# Patient Record
Sex: Female | Born: 1955 | Race: White | Hispanic: No | Marital: Married | Smoking: Never smoker
Health system: Southern US, Community
[De-identification: ages and names within clinical notes are randomized; demographics above are authoritative.]

## PROBLEM LIST (undated history)

## (undated) DIAGNOSIS — R51 Headache: Secondary | ICD-10-CM

## (undated) DIAGNOSIS — G4730 Sleep apnea, unspecified: Secondary | ICD-10-CM

## (undated) DIAGNOSIS — I471 Supraventricular tachycardia, unspecified: Secondary | ICD-10-CM

## (undated) DIAGNOSIS — I10 Essential (primary) hypertension: Secondary | ICD-10-CM

## (undated) DIAGNOSIS — R519 Headache, unspecified: Secondary | ICD-10-CM

## (undated) DIAGNOSIS — I341 Nonrheumatic mitral (valve) prolapse: Secondary | ICD-10-CM

## (undated) DIAGNOSIS — C4491 Basal cell carcinoma of skin, unspecified: Secondary | ICD-10-CM

## (undated) HISTORY — DX: Nonrheumatic mitral (valve) prolapse: I34.1

## (undated) HISTORY — DX: Headache, unspecified: R51.9

## (undated) HISTORY — PX: ROOT CANAL: SHX2363

## (undated) HISTORY — DX: Sleep apnea, unspecified: G47.30

## (undated) HISTORY — DX: Essential (primary) hypertension: I10

## (undated) HISTORY — DX: Supraventricular tachycardia, unspecified: I47.10

## (undated) HISTORY — DX: Supraventricular tachycardia: I47.1

## (undated) HISTORY — DX: Basal cell carcinoma of skin, unspecified: C44.91

## (undated) HISTORY — DX: Headache: R51

---

## 1992-12-15 HISTORY — PX: TONSILLECTOMY: SUR1361

## 1996-12-15 HISTORY — PX: BACK SURGERY: SHX140

## 1998-07-19 ENCOUNTER — Observation Stay (HOSPITAL_COMMUNITY): Admission: RE | Admit: 1998-07-19 | Discharge: 1998-07-19 | Payer: Self-pay | Admitting: Neurosurgery

## 1999-03-29 ENCOUNTER — Emergency Department (HOSPITAL_COMMUNITY): Admission: EM | Admit: 1999-03-29 | Discharge: 1999-03-29 | Payer: Self-pay | Admitting: Emergency Medicine

## 2003-02-21 ENCOUNTER — Encounter: Admission: RE | Admit: 2003-02-21 | Discharge: 2003-02-21 | Payer: Self-pay | Admitting: Internal Medicine

## 2003-02-21 ENCOUNTER — Encounter: Payer: Self-pay | Admitting: Internal Medicine

## 2004-10-17 ENCOUNTER — Ambulatory Visit (HOSPITAL_COMMUNITY): Admission: RE | Admit: 2004-10-17 | Discharge: 2004-10-17 | Payer: Self-pay | Admitting: Internal Medicine

## 2006-11-26 ENCOUNTER — Encounter: Admission: RE | Admit: 2006-11-26 | Discharge: 2006-11-26 | Payer: Self-pay | Admitting: Internal Medicine

## 2009-05-29 ENCOUNTER — Encounter: Admission: RE | Admit: 2009-05-29 | Discharge: 2009-05-29 | Payer: Self-pay | Admitting: Internal Medicine

## 2009-06-11 ENCOUNTER — Encounter: Admission: RE | Admit: 2009-06-11 | Discharge: 2009-06-11 | Payer: Self-pay | Admitting: Obstetrics and Gynecology

## 2010-12-12 ENCOUNTER — Encounter
Admission: RE | Admit: 2010-12-12 | Discharge: 2010-12-12 | Payer: Self-pay | Source: Home / Self Care | Attending: Chiropractic Medicine | Admitting: Chiropractic Medicine

## 2011-12-20 IMAGING — CR DG CERVICAL SPINE 2 OR 3 VIEWS
2 series · 2 of 2 positions shown · non-contrast
Comparison: None.

CLINICAL DATA: Migraine headaches.  Neck pain and stiffness.

CERVICAL SPINE - 2-3 VIEW

[w c-spine lat]
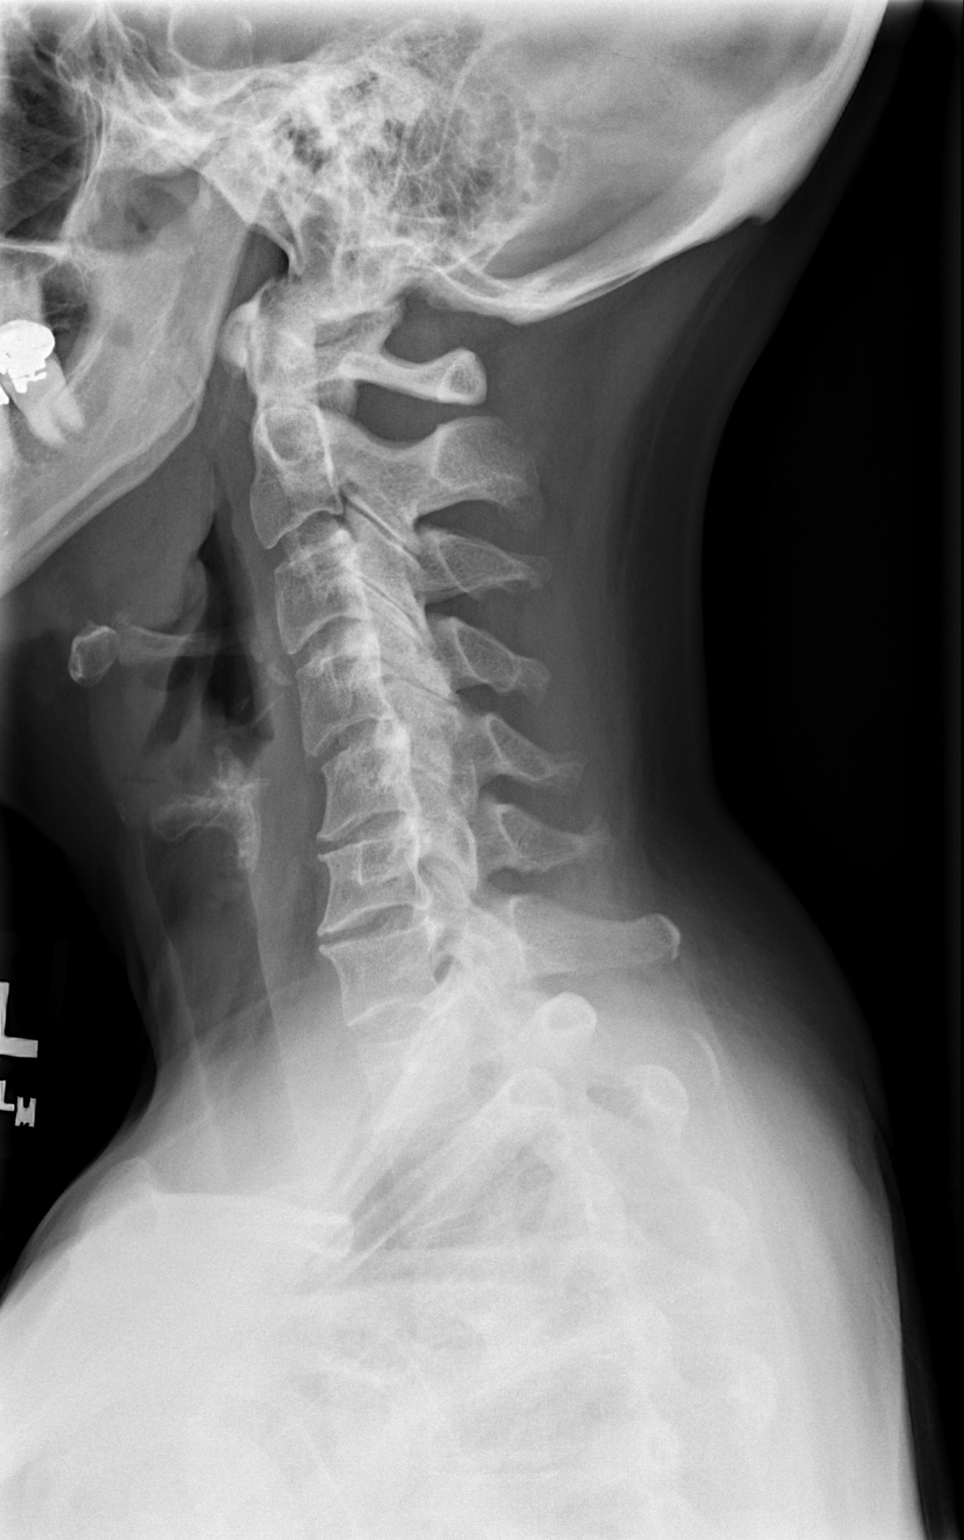

[w c-spine a.p. *]
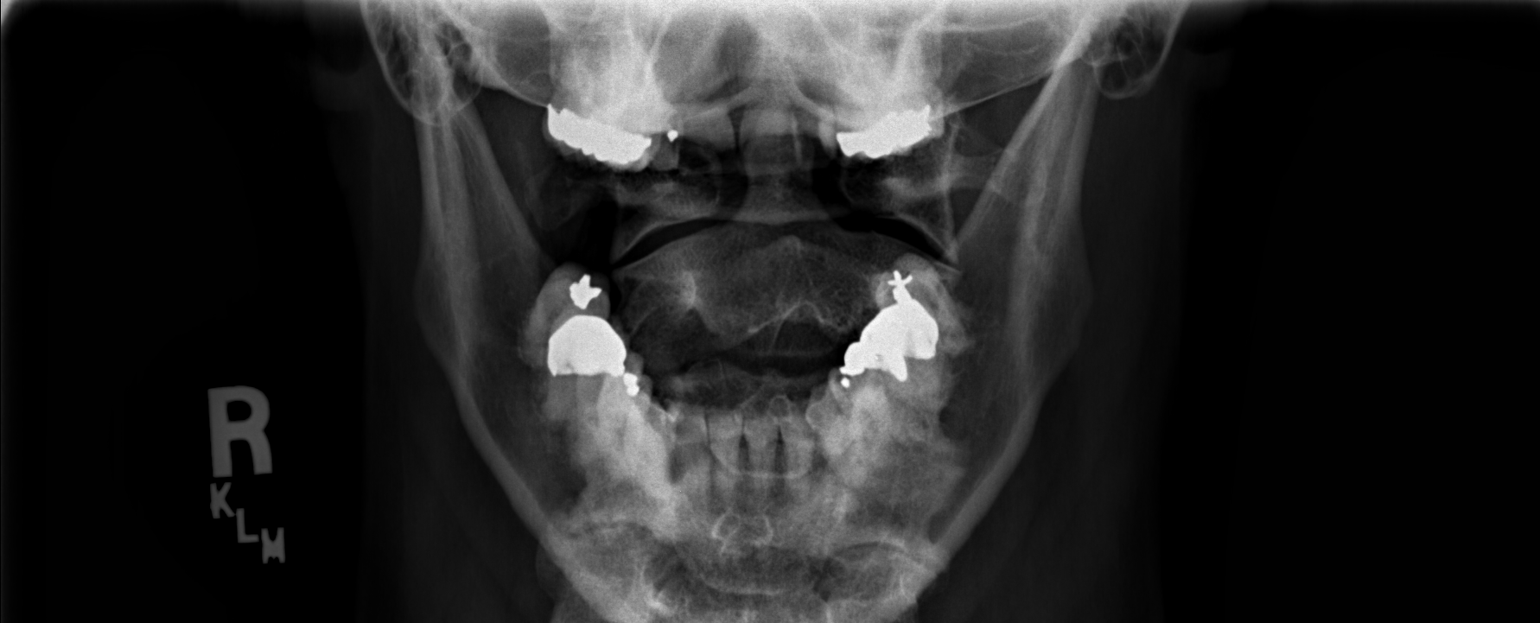

[2 of 2 positions shown; findings below may reference images not displayed]

FINDINGS: Degenerative disc space narrowing is noted at the C5-C6
and C6-C7 levels.  There is mild (2 mm) anterolisthesis of C4 on C5
with loss of the normal cervical lordotic curve.  The
craniovertebral junction has a normal appearance.  There are no
destructive changes and there are no fractures.
IMPRESSION: 1.  C5-C6 and C6-C7 degenerative disc space narrowing.
2.  Mild (2 mm) anterolisthesis of C4 on C5 most likely
degenerative in nature.

## 2012-12-15 HISTORY — PX: COLONOSCOPY: SHX174

## 2013-05-12 ENCOUNTER — Encounter: Payer: Self-pay | Admitting: Neurology

## 2013-05-23 NOTE — Progress Notes (Unsigned)
   CPAP download review.  Patient is poorly compliant with CPAP, then using CPAP she achieves a market reduction in a H. I.  [facility] is already working with her.  Gerldine Suleiman, MD

## 2013-07-06 ENCOUNTER — Encounter: Payer: Self-pay | Admitting: Internal Medicine

## 2013-08-26 ENCOUNTER — Ambulatory Visit (AMBULATORY_SURGERY_CENTER): Payer: BC Managed Care – PPO | Admitting: *Deleted

## 2013-08-26 VITALS — Ht 68.0 in | Wt 149.0 lb

## 2013-08-26 DIAGNOSIS — Z1211 Encounter for screening for malignant neoplasm of colon: Secondary | ICD-10-CM

## 2013-08-26 MED ORDER — MOVIPREP 100 G PO SOLR: ORAL | Status: DC

## 2013-08-26 NOTE — Progress Notes (Signed)
No allergies to eggs or soy. No problems with anesthesia.  

## 2013-08-31 ENCOUNTER — Encounter: Payer: Self-pay | Admitting: Internal Medicine

## 2013-09-09 ENCOUNTER — Encounter: Payer: Self-pay | Admitting: Internal Medicine

## 2013-09-09 ENCOUNTER — Ambulatory Visit (AMBULATORY_SURGERY_CENTER): Payer: BC Managed Care – PPO | Admitting: Internal Medicine

## 2013-09-09 VITALS — BP 139/76 | HR 56 | Temp 98.3°F | Resp 28 | Ht 68.0 in | Wt 149.0 lb

## 2013-09-09 DIAGNOSIS — Z1211 Encounter for screening for malignant neoplasm of colon: Secondary | ICD-10-CM

## 2013-09-09 DIAGNOSIS — D126 Benign neoplasm of colon, unspecified: Secondary | ICD-10-CM

## 2013-09-09 MED ORDER — SODIUM CHLORIDE 0.9 % IV SOLN: 500.0000 mL | INTRAVENOUS | Status: DC

## 2013-09-09 NOTE — Patient Instructions (Addendum)

## 2013-09-09 NOTE — Op Note (Signed)
[facility] [address] [place], [address]   COLONOSCOPY PROCEDURE REPORT  PATIENT: [name], [name]  MR#: [id number] BIRTHDATE: 10-27-1956 , 56  yrs. old GENDER: Female ENDOSCOPIST: Hart Carwin, MD REFERRED NW:[id number] Avva, M.D. PROCEDURE DATE:  09/09/2013 PROCEDURE:   Colonoscopy with biopsy First Screening Colonoscopy - Avg.  risk and is 50 yrs.  old or older Yes.  Prior Negative Screening - Now for repeat screening. N/A  History of Adenoma - Now for follow-up colonoscopy & has been > or = to 3 yrs.  N/A  Polyps Removed Today? Yes. ASA CLASS:   Class I INDICATIONS:Average risk patient for colon cancer. MEDICATIONS: MAC sedation, administered by CRNA and propofol (Diprivan) 250mg  IV  DESCRIPTION OF PROCEDURE:   After the risks benefits and alternatives of the procedure were thoroughly explained, informed consent was obtained.  A digital rectal exam revealed no abnormalities of the rectum.   The LB [id number]-[id number]  endoscope was introduced through the anus and advanced to the cecum, which was identified by both the appendix and ileocecal valve. No adverse events experienced.   The quality of the prep was excellent, using MoviPrep  The instrument was then slowly withdrawn as the colon was fully examined.      COLON FINDINGS: A diminutive firm sessile polyp was found in the sigmoid colon.  A polypectomy was performed with cold forceps.  The resection was complete and the polyp tissue was completely retrieved.   Small internal hemorrhoids were found.  Retroflexed views revealed no abnormalities. The time to cecum=9 minutes 8 seconds.  Withdrawal time=8 minutes 20 seconds.  The scope was withdrawn and the procedure completed. COMPLICATIONS: There were no complications.  ENDOSCOPIC IMPRESSION: 1.   Diminutive sessile polyp was found in the sigmoid colon; polypectomy was performed with cold forceps 2.   Small internal  hemorrhoids  RECOMMENDATIONS: 1.  Await pathology results 2.  High fiber diet 3.   recall colonoscopy 10 years   eSigned:  Hart Carwin, MD 09/09/2013 12:54 PM   cc:   PATIENT NAME:  Patricia Rangel, Patricia Rangel MR#: [id number]

## 2013-09-09 NOTE — Progress Notes (Signed)
Procedure ends, to recovery, report given and VSS. 

## 2013-09-09 NOTE — Progress Notes (Signed)
Called to room to assist during endoscopic procedure.  Patient ID and intended procedure confirmed with present staff. Received instructions for my participation in the procedure from the performing physician.  

## 2013-09-09 NOTE — Progress Notes (Signed)
Patient did not experience any of the following events: a burn prior to discharge; a fall within the facility; wrong site/side/patient/procedure/implant event; or a hospital transfer or hospital admission upon discharge from the facility. (G8907)Patient did not have preoperative order for IV antibiotic SSI prophylaxis. (G8918) ewm 

## 2013-09-12 ENCOUNTER — Telehealth: Payer: Self-pay

## 2013-09-12 NOTE — Telephone Encounter (Signed)
Left a message on the pt's answering machine #[phone] to call us back if she has any questions or concerns.maw

## 2013-09-13 ENCOUNTER — Encounter: Payer: Self-pay | Admitting: Internal Medicine

## 2013-09-27 ENCOUNTER — Other Ambulatory Visit: Payer: Self-pay | Admitting: Obstetrics and Gynecology

## 2015-03-21 ENCOUNTER — Other Ambulatory Visit: Payer: Self-pay | Admitting: Obstetrics and Gynecology

## 2015-03-22 LAB — CYTOLOGY - PAP

## 2016-08-13 ENCOUNTER — Encounter: Payer: Self-pay | Admitting: Neurology

## 2016-08-13 ENCOUNTER — Ambulatory Visit (INDEPENDENT_AMBULATORY_CARE_PROVIDER_SITE_OTHER): Payer: BLUE CROSS/BLUE SHIELD | Admitting: Neurology

## 2016-08-13 VITALS — BP 124/84 | HR 88 | Resp 20 | Ht 69.0 in | Wt 150.0 lb

## 2016-08-13 DIAGNOSIS — G43109 Migraine with aura, not intractable, without status migrainosus: Secondary | ICD-10-CM

## 2016-08-13 DIAGNOSIS — G4701 Insomnia due to medical condition: Secondary | ICD-10-CM

## 2016-08-13 DIAGNOSIS — G44001 Cluster headache syndrome, unspecified, intractable: Secondary | ICD-10-CM

## 2016-08-13 NOTE — Progress Notes (Signed)
SLEEP MEDICINE CLINIC   Provider:  Larey Seat, M D  Referring Provider: Prince Solian, MD Primary Care Physician:  Tivis Ringer, MD  Chief Complaint  Patient presents with  . New Patient (Initial Visit)    hasn't been using cpap, can't sleep    HPI:  Patricia Rangel is a 60 y.o. female , seen here as a referral from Dr. Dagmar Hait for a re -evaluation of sleep apnea, now with insomnia and a non working CPAP machine.   Chief complaint according to patient : "I cannot longer sleep in 2 hours en bloc", "  I wake up with a severe startle, and this will evolve into a severe headache"  I have seen Patricia Rangel almost 4 years ago for a sleep evaluation she had a history of obstructive sleep apnea with prior CPAP use through the year 2009 but was unable to continue using CPAP. She presented at that  time with sleep complains of excessive daytime sleepiness waking with a dry mouth very fragmented sleep with morning headache and nocturia.  I had ordered a split-night polysomnography which was performed on 02/16/2013 and showed in the baseline diagnostic part an AHI of 24.3 per hour of sleep and an RDI of 28.2. During REM sleep her apnea exacerbated to 81.6 per hour there was no supine accentuation. Oxygen nadir was 81% but only 9.8 minutes of total desaturation time were seen. Patricia Rangel was titrated to CPAP at only 6 cm water pressure was reduced her AHI to 0.5 and nasal pillow was used with heated humidification. I ordered the CPAP to start at 6 cm water. She presents now with a [employer] CPAP cube which she not longer uses, and reports her sleep has declined in quality while on it and after discontinuation.    Sleep habits are as follows: She aims for a bedtime of 10 PM, she has a white noise machine in her bedroom. She has tried lavender oil in the diffusor. Nonetheless she seems to wake up every hour on the hour. The early morning hours she will have a feeling of a band like   tightness around the head , she usually tries to leave the bed and bedroom and walk. It seems to be around 2:30 AM that her headaches do wake her and hinder her to go back to sleep. If she can go back to sleep she may wake up with her even more severe headache the next time. She rises at 6.30 AM. She is usually already awake for hours. She never feels rested, but is not sleepy. She may take a nap in daytime, feels fatigued- but is not able to go to sleep.    Sleep medical history and family sleep history:  Insomnia for over 1.5  Years,  History of moderate OSA , not feeling better and not having less headaches while on CPAP at 6 cm water .  tonsillectomy at age 24, with prolonged recovery time. No TBI or neck surgeries. Childhood  history of sleep walking. No enuresis no night terrors.  One daughter is a sleep walker.    Social history:  Married mother of 49, now adult daughters. She is a lifelong nonsmoker and does not use any other form of tobacco, she does not drink alcohol, she drinks 1-2 cups of coffee in the morning, ice tea 2 glasses a day as well , but not soda.   Review of Systems:  Out of a complete 14 system review, the patient  complains of only the following symptoms, and all other reviewed systems are negative. Husband reports that she snores, she has audible breathing and sometimes seems to struggle for air.   Epworth score ZERO , Fatigue severity score 56  , depression score 1/15    Social History   Social History  . Marital status: Single    Spouse name: N/A  . Number of children: N/A  . Years of education: N/A   Occupational History  . Not on file.   Social History Main Topics  . Smoking status: Never Smoker  . Smokeless tobacco: Never Used  . Alcohol use No  . Drug use: No  . Sexual activity: Not on file   Other Topics Concern  . Not on file   Social History Narrative  . No narrative on file    Family History  Problem Relation Age of Onset  .  Hypertension Mother   . Thyroid disease Mother   . Hypercholesterolemia Mother   . Diabetes Father   . Hypertension Father   . Colon cancer Neg Hx     Past Medical History:  Diagnosis Date  . Basal cell carcinoma   . Headache   . Hypertension   . Mitral valve prolapse   . Sleep apnea    cpap  . SVT (supraventricular tachycardia) (HCC)     Past Surgical History:  Procedure Laterality Date  . BACK SURGERY  1998  . ROOT CANAL    . TONSILLECTOMY  1994    Current Outpatient Prescriptions  Medication Sig Dispense Refill  . valsartan (DIOVAN) 80 MG tablet Take 80 mg by mouth daily.     No current facility-administered medications for this visit.     Allergies as of 08/13/2016 - Review Complete 08/13/2016  Allergen Reaction Noted  . Penicillins Hives 08/26/2013  . Sulfa antibiotics Hives 08/26/2013    Vitals: BP 124/84   Pulse 88   Resp 20   Ht 5\' 9"  (1.753 m)   Wt 150 lb (68 kg)   BMI 22.15 kg/m  Last Weight:  Wt Readings from Last 1 Encounters:  08/13/16 150 lb (68 kg)   [id number] mass index is 22.15 kg/m.     Last Height:   Ht Readings from Last 1 Encounters:  08/13/16 5\' 9"  (1.753 m)    Physical exam:  General: The patient is awake, alert and appears not in acute distress. The patient is well groomed. Head: Normocephalic, atraumatic. Neck is supple. Mallampati 2,  neck circumference:14.5 . Nasal airflow patent , TMJ is not evident . Retrognathia is seen.  Cardiovascular:  Regular rate and rhythm, without  murmurs or carotid bruit, and without distended neck veins. Respiratory: Lungs are clear to auscultation. Skin:  Without evidence of edema, or rash Trunk: BMI is normal . The patient's posture is erect    Neurologic exam : The patient is awake and alert, oriented to place and time.   Memory subjective described as intact.  Attention span & concentration ability appears normal.  Speech is fluent, without dysarthria, dysphonia or aphasia.  Mood and  affect are appropriate.  Cranial nerves: Pupils are equal and briskly reactive to light. Funduscopic exam without evidence of pallor or edema. Extraocular movements  in vertical and horizontal planes intact and without nystagmus. Visual fields by finger perimetry are intact.Hearing to finger rub intact.  Facial sensation intact to fine touch. Facial motor strength is symmetric and tongue and uvula move midline. Shoulder shrug was symmetrical.   Motor  exam: Normal tone, muscle bulk and symmetric strength in all extremities. Sensory:  Fine touch, pinprick and vibration / Proprioception tested in the upper extremities was normal. Coordination: Rapid alternating movements in the fingers/hands was normal. Finger-to-nose maneuver  normal without evidence of ataxia, dysmetria or tremor. Gait and station: Patient walks without assistive device and is able unassisted to climb up to the exam table. Strength within normal limits.  Stance is stable and normal.  Deep tendon reflexes: in the  upper and lower extremities are symmetric and intact. Babinski maneuver response is downgoing.  The patient was advised of the nature of the diagnosed sleep disorder , the treatment options and risks for general a health and wellness arising from not treating the condition.  I spent more than 35  minutes of face to face time with the patient. Greater than 50% of time was spent in counseling and coordination of care. We have discussed the diagnosis and differential and I answered the patient's questions.     Assessment:  After physical and neurologic examination, review of laboratory studies,  Personal review of imaging studies, reports of other /same  Imaging studies ,  Results of polysomnography/ neurophysiology testing and pre-existing records as far as provided in visit., my assessment is   1) the patient has sleep related headaches that culminate and nausea, vomiting and diarrhea. They lower her quality of life due to  the frequency and increase her apprehension of sleep. Headaches may last 24 hours. She died describes clearly an early morning onset with a sharp, stabbing almost jabbing quality to the pain and she clearly describes cluster headaches. This is not a migraine some form. Cluster headaches can be treated with oxygen. My goal is to use a sleep study to document either hypoxemia or hypercapnia and to apply oxygen if the patient wakes up with an cluster headache.  2) insomnia , behavioural  3)cluster headaches  4) untreated apnea.   Plan:  Treatment plan and additional workup :  I need this patient urgently before October 1 to be evaluated in an  attended sleep study.  I need the technologist to apply oxygen to document its effect on the treatment of episodic cluster headaches.  I appreciate capnography. It is well possible that the patient still presents with sleep apnea as she had 4 years ago, however snoring seems to be more her problem than the actual apnea and unless she qualifies for a split night I would appreciate if we titrated her with oxygen instead of CPAP.     Asencion Partridge Philamena Kramar MD  08/13/2016   CC: Prince Solian, [place], [place] [address]

## 2016-08-13 NOTE — Patient Instructions (Signed)
Cluster Headache Cluster headaches are deeply painful. They normally occur on one side of your head, but they may switch sides. Often, cluster headaches:  Are severe.  Happen often for a few weeks or months and then go away for a while.  Last from 15 minutes to 3 hours.  Happen at the same time each day.  Happen at night.  Happen many times a day. HOME CARE  During times when you have cluster headaches:  Get the same amount of sleep every night, at the same time each night.  Avoid alcohol.  Stop smoking if you smoke. GET HELP IF:  There are changes in how bad or how often your headaches happen.  Your medicines are not helping. GET HELP RIGHT AWAY IF:  You pass out (faint).  You become weak or lose feeling (have numbness) on one side of your body or face.  You see two of everything (double vision).  You feel sick to your stomach (nauseous) or throw up (vomit) and do not stop after several hours.  You are off balance or have trouble talking or walking.  You have neck pain or stiffness.  You have a fever. MAKE SURE YOU:  Understand these instructions.  Will watch your condition.  Will get help right away if you are not doing well or get worse.   This information is not intended to replace advice given to you by your health care provider. Make sure you discuss any questions you have with your health care provider.   Document Released: 01/08/2005 Document Revised: 12/22/2014 Document Reviewed: 06/23/2013 Elsevier Interactive Patient Education 2016 Elsevier Inc.  

## 2016-08-26 ENCOUNTER — Telehealth: Payer: Self-pay | Admitting: Neurology

## 2016-08-26 DIAGNOSIS — G4700 Insomnia, unspecified: Secondary | ICD-10-CM

## 2016-08-26 DIAGNOSIS — R682 Dry mouth, unspecified: Secondary | ICD-10-CM

## 2016-08-26 DIAGNOSIS — R519 Headache, unspecified: Secondary | ICD-10-CM

## 2016-08-26 DIAGNOSIS — R51 Headache: Secondary | ICD-10-CM

## 2016-08-26 DIAGNOSIS — G4733 Obstructive sleep apnea (adult) (pediatric): Secondary | ICD-10-CM

## 2016-08-26 NOTE — Telephone Encounter (Signed)
[employer] denied Split and HST for this patient.  If you would like to discuss this decision you can call [employer] at [phone]

## 2016-09-02 ENCOUNTER — Encounter (INDEPENDENT_AMBULATORY_CARE_PROVIDER_SITE_OTHER): Payer: BLUE CROSS/BLUE SHIELD | Admitting: Neurology

## 2016-09-02 DIAGNOSIS — G4733 Obstructive sleep apnea (adult) (pediatric): Secondary | ICD-10-CM | POA: Diagnosis not present

## 2016-09-09 ENCOUNTER — Telehealth: Payer: Self-pay

## 2016-09-09 DIAGNOSIS — G4733 Obstructive sleep apnea (adult) (pediatric): Secondary | ICD-10-CM

## 2016-09-09 NOTE — Telephone Encounter (Signed)
I called pt to discuss sleep study results. No answer, left a message asking her to call me back. 

## 2016-09-09 NOTE — Telephone Encounter (Signed)
I spoke to pt. I advised her that her sleep study showed that she had a moderate severity degree of apnea on HST. Dr. Brett Fairy says that for this degree of apnea, several treatment options exist, including cpap or dental device. Pt says that she would prefer to start a cpap again and is agreeable to a cpap titration study. Her insurance changes on 09/14/16 from [facility] to [facility]. She is wondering if we could get approval from [facility] before then? I advised her that I would have our sleep lab manager call her to discuss. Pt verbalized understanding of results. Pt had no questions at this time but was encouraged to call back if questions arise.

## 2016-09-10 ENCOUNTER — Other Ambulatory Visit: Payer: Self-pay

## 2016-09-10 DIAGNOSIS — R519 Headache, unspecified: Secondary | ICD-10-CM

## 2016-09-10 DIAGNOSIS — R682 Dry mouth, unspecified: Secondary | ICD-10-CM

## 2016-09-10 DIAGNOSIS — R51 Headache: Secondary | ICD-10-CM

## 2016-09-10 DIAGNOSIS — G4733 Obstructive sleep apnea (adult) (pediatric): Secondary | ICD-10-CM

## 2016-09-10 DIAGNOSIS — G4700 Insomnia, unspecified: Secondary | ICD-10-CM

## 2016-09-10 NOTE — Telephone Encounter (Signed)
Spoke with patient. I let her know that we would submit authorization for Cpap titration and if we had a cancelation this week we would call her. If not she will being her new insurance card by here next week and we will submit it for pre-auth.

## 2016-09-10 NOTE — Telephone Encounter (Signed)
Noted, thanks Robin. 

## 2016-09-25 NOTE — Telephone Encounter (Signed)
Pt called inquiring if Dawn had gotten in touch with insurance and what is the status at this point . Please call

## 2016-10-14 ENCOUNTER — Ambulatory Visit (INDEPENDENT_AMBULATORY_CARE_PROVIDER_SITE_OTHER): Payer: Managed Care, Other (non HMO) | Admitting: Neurology

## 2016-10-14 DIAGNOSIS — G4733 Obstructive sleep apnea (adult) (pediatric): Secondary | ICD-10-CM

## 2016-10-15 ENCOUNTER — Encounter: Payer: Self-pay | Admitting: *Deleted

## 2016-10-17 ENCOUNTER — Telehealth: Payer: Self-pay | Admitting: Neurology

## 2016-10-17 DIAGNOSIS — G4733 Obstructive sleep apnea (adult) (pediatric): Secondary | ICD-10-CM

## 2016-10-17 NOTE — Telephone Encounter (Signed)
Patient was diagnosed with OSA and titrated  To 7 cm water pressure under use of an( RESMED ) Interface , an  airfit p 10  in medium size.  Heated humidity , as always.   CD

## 2016-10-20 NOTE — Telephone Encounter (Signed)
I called pt to discuss her sleep study results. No answer, left a message asking her to call me back. 

## 2016-10-22 NOTE — Telephone Encounter (Signed)
Patient returned Kristen's call °

## 2016-10-22 NOTE — Telephone Encounter (Signed)
I spoke to pt. I advised her that her sleep study was reviewed by Dr. Brett Fairy and found that her osa responded well to cpap titration. Dr. Brett Fairy recommends starting a cpap. Pt is agreeable to starting a cpap. I advised her that the order for a cpap will be sent to a DME and they will call the pt within a week to have her CPAP set up. Will send to [facility]. Pt was advised to not drive or operate hazardous machinery when sleepy and until her osa has been treated and symptoms of daytime sleepiness have resolved. A follow up appt was made with Dr. Brett Fairy for 01/07/2017 at 2:30pm. Pt verbalized understanding of results. Pt had no questions at this time but was encouraged to call back if questions arise.

## 2016-11-11 NOTE — Telephone Encounter (Signed)
Received this notice from [facility]: "Patricia Rangel has canceled her appointment for set up tomorrow due to finances and advised it would likely be after January when her deductible year starts over before she is ready to schedule.  She plans to call me to schedule when she is ready and I have tasked myself to contact her to follow up in January if I have not yet heard back from her.   She is aware of needing a follow up appointment with Dr. Brett Fairy after set up to discuss use and benefit of Cpap therapy.  Please let me know if there is anything I can do for you."

## 2017-01-07 ENCOUNTER — Ambulatory Visit: Payer: Self-pay | Admitting: Neurology

## 2017-03-11 ENCOUNTER — Ambulatory Visit: Payer: Managed Care, Other (non HMO) | Admitting: Neurology

## 2017-06-15 ENCOUNTER — Ambulatory Visit: Payer: Managed Care, Other (non HMO) | Admitting: Neurology

## 2017-06-18 ENCOUNTER — Encounter: Payer: Self-pay | Admitting: Neurology

## 2017-06-18 ENCOUNTER — Ambulatory Visit (INDEPENDENT_AMBULATORY_CARE_PROVIDER_SITE_OTHER): Payer: Managed Care, Other (non HMO) | Admitting: Neurology

## 2017-06-18 VITALS — BP 137/88 | HR 73 | Ht 68.0 in | Wt 155.0 lb

## 2017-06-18 DIAGNOSIS — Z9989 Dependence on other enabling machines and devices: Secondary | ICD-10-CM | POA: Diagnosis not present

## 2017-06-18 DIAGNOSIS — G4733 Obstructive sleep apnea (adult) (pediatric): Secondary | ICD-10-CM | POA: Diagnosis not present

## 2017-06-18 NOTE — Patient Instructions (Signed)
I suggested to have a cardiac monitor arranged through Dr. Bari Mantis office, that he can rule out cardiac arrhythmia overnight. Please wear his CPAP each night during the testing time. CD

## 2017-06-18 NOTE — Progress Notes (Signed)
SLEEP MEDICINE CLINIC   Provider:  Larey Seat, M D  Referring Provider: Prince Solian, MD Primary Care Physician:  Prince Solian, MD  Chief Complaint  Patient presents with  . Follow-up    HPI:  Patricia Rangel is a 61 y.o. female , seen here as a referral from Dr. Dagmar Hait for a re -evaluation of sleep apnea, now with insomnia and a possibly non working CPAP.   Interval history from 06/18/2017. I have the pleasure of seeing Patricia Rangel today, who underwent a home sleep test on 09/02/2016, which diagnosed her with an AHI of 17.4, RDI of 21.1 but in significant oxygen desaturations. Only 6 minutes of total sleep time were spent at or below 88% oxygen saturation. The main concern was that this patient had irregular heartbeats with the fastest being 205 bpm and the slowest 48 bpm. For this reason she was not considered a candidate for dental device but had to return for CPAP titration. CPAP titration took place on 10/14/2016 and reduced her AHI to 0.2 at a pressure of only 7 cm water pressure she also used a nasal pillow in medium size 8 by [employer] fit P 10 . I'm able to review a CPAP download with 100% compliance and an average user time of 6 hours and 3 minutes, the patient uses CPAP at 7 cm water pressure with a 3 cm expiratory pressure relief function. Residual AHI is 2.7 which is ideal, she does have minimal air leaks there is no evidence of patient's change any setting .  Patricia Rangel still experiences occasional jolting arousals, she will be awake for up to 20 minutes trying to go back to sleep. These arousals a scary. She does not experience shortness of breath and her headaches have improved since she uses CPAP she certainly is no longer snoring. The question remains if she still has tachycardia and bradycardia spells but could culminate in such a jolt. She sleeps still in intervals of 90-120 minutes. Anxiety?   Chief complaint according to patient : "I cannot longer sleep in 2  hours en bloc", "  I wake up with a severe startle, and this will evolve into a severe headache" I have seen Patricia Rangel almost 4 years ago for a sleep evaluation she had a history of obstructive sleep apnea with prior CPAP use through the year 2009 but was unable to continue using CPAP. She presented at that  time with sleep complains of excessive daytime sleepiness waking with a dry mouth very fragmented sleep with morning headache and nocturia. I had ordered a split-night polysomnography which was performed on 02/16/2013 and showed in the baseline diagnostic part an AHI of 24.3 per hour of sleep and an RDI of 28.2. During REM sleep her apnea exacerbated to 81.6 per hour there was no supine accentuation. Oxygen nadir was 81% but only 9.8 minutes of total desaturation time were seen.  Sleep habits are as follows:She aims for a bedtime of 10 PM, she has a white noise machine in her bedroom. She has tried lavender oil in the diffusor. Nonetheless she seems to wake up every hour on the hour.The early morning hours she will have a feeling of a band like  tightness around the head , she usually tries to leave the bed and bedroom and walk. It seems to be around 2:30 AM that her headaches do wake her and hinder her to go back to sleep. If she can go back to sleep she may wake  up with her even more severe headache the next time.She rises at 6.30 AM. She is usually already awake for hours. She never feels rested, but is not sleepy. She may take a nap in daytime, feels fatigued- but is not able to go to sleep.   Sleep medical history and family sleep history:  Insomnia for over 2 Years,  Tonsillectomy at age 31, with prolonged recovery time. No TBI or neck surgeries. Childhood  history of sleep walking. No enuresis no night terrors.  One daughter is a sleep walker.    Social history:  Married mother of 65, now adult daughters. Her daughters were involved in a severe car accident.   She is a lifelong nonsmoker and  does not use any other form of tobacco, she does not drink alcohol, she drinks 1-2 cups of coffee in the morning, ice tea 2 glasses a day as well , but not soda.   Review of Systems:  Out of a complete 14 system review, the patient complains of only the following symptoms, and all other reviewed systems are negative. History of moderate OSA , not feeling better and not having less headaches while on CPAP at 6 cm water .no HA improved on only 7 cm water.   Epworth score 4 , Fatigue severity score 52 , depression score 1/15    Social History   Social History  . Marital status: Single    Spouse name: N/A  . Number of children: N/A  . Years of education: N/A   Occupational History  . Not on file.   Social History Main Topics  . Smoking status: Never Smoker  . Smokeless tobacco: Never Used  . Alcohol use No  . Drug use: No  . Sexual activity: Not on file   Other Topics Concern  . Not on file   Social History Narrative  . No narrative on file    Family History  Problem Relation Age of Onset  . Hypertension Mother   . Thyroid disease Mother   . Hypercholesterolemia Mother   . Diabetes Father   . Hypertension Father   . Colon cancer Neg Hx     Past Medical History:  Diagnosis Date  . Basal cell carcinoma   . Headache   . Hypertension   . Mitral valve prolapse   . Sleep apnea    cpap  . SVT (supraventricular tachycardia) (HCC)     Past Surgical History:  Procedure Laterality Date  . BACK SURGERY  1998  . ROOT CANAL    . TONSILLECTOMY  1994    Current Outpatient Prescriptions  Medication Sig Dispense Refill  . valsartan (DIOVAN) 80 MG tablet Take 80 mg by mouth daily.     No current facility-administered medications for this visit.     Allergies as of 06/18/2017 - Review Complete 06/18/2017  Allergen Reaction Noted  . Penicillins Hives 08/26/2013  . Sulfa antibiotics Hives 08/26/2013    Vitals: BP 137/88   Pulse 73   Ht 5\' 8"  (1.727 m)   Wt 155 lb  (70.3 kg)   BMI 23.57 kg/m  Last Weight:  Wt Readings from Last 1 Encounters:  06/18/17 155 lb (70.3 kg)   DJS:HFWY mass index is 23.57 kg/m.     Last Height:   Ht Readings from Last 1 Encounters:  06/18/17 5\' 8"  (1.727 m)    Physical exam:  General: The patient is awake, alert and appears not in acute distress. The patient is well groomed. Head:  Normocephalic, atraumatic. Neck is supple. Mallampati 2,  neck circumference:14.25 .  Nasal airflow patent , TMJ is not evident . Retrognathia is seen.  Cardiovascular:  Regular rate and rhythm, without  murmurs or carotid bruit, and without distended neck veins. Respiratory: Lungs are clear to auscultation.    Neurologic exam :The patient is awake and alert, oriented to place and time.   Memory subjective described as intact.  Attention span & concentration ability appears normal.  Speech is fluent, without dysarthria, dysphonia or aphasia.  Mood and affect are appropriate.  Cranial nerves: Pupils are equal and briskly reactive to light. Visual fields by finger perimetry are intact. Hearing to finger rub intact.   Facial sensation intact to fine touch. Facial motor strength is symmetric and tongue and uvula move midline. Shoulder shrug was symmetrical.   Motor exam: Normal tone, muscle bulk and symmetric strength in all extremities. Sensory:  Fine touch, pinprick and vibration / Proprioception tested in the upper extremities was normal. Coordination: Rapid alternating movements in the fingers/hands was normal. Finger-to-nose maneuver  normal without evidence of ataxia, dysmetria or tremor. Gait and station: Patient walks without assistive device and is able unassisted to climb up to the exam table. Strength within normal limits.  Stance is stable and normal.  Deep tendon reflexes: in the  upper and lower extremities are symmetric and intact. Babinski maneuver response is downgoing.  The patient was advised of the nature of the  diagnosed sleep disorder , the treatment options and risks for general a health and wellness arising from not treating the condition.  I spent more than 25  minutes of face to face time with the patient. Greater than 50% of time was spent in counseling and coordination of care. We have discussed the diagnosis and differential and I answered the patient's questions.     Assessment:  After physical and neurologic examination, review of laboratory studies,  Personal review of imaging studies, reports of other /same  Imaging studies ,  Results of polysomnography/ neurophysiology testing and pre-existing records as far as provided in visit., my assessment is    1 ) OSA - on CPAP ; Patricia Rangel had success with using CPAP in the way her headaches have responded. She describes a new sensation of tingling numbness, she still has very fragmented sleep and she has sometimes arousals that she describes as a jolting sensation. The jolt is not a reaction to pain or associated with pain but is almost a panic sensation leading to an arousal. Wakes with a jolt , and needs 20 minutes to settle down.    2) insomnia-not struggling going to sleep. It is a problem to stay asleep. I still think anxiety plays a role-  recommended a re- check of heart rate over night, during sleep. Possible arrhythmia.   3) HTN   Plan:  Treatment plan and additional workup :  I am fairly satisfied with the patient's response to CPAP. I like for Mr Loomer to wear either a cardiac monitor or use a ONO  While on CPAP. I am concerned that the jolting arousals could be related to cardiac arrhythmia, tachycardia. She wakes up with palpitations. I will asked Dr. Francia Greaves to arrange for a cardiac monitor. Rule out a fib.      Asencion Partridge Emmalyn Hinson MD  06/18/2017   CC: Prince Solian, [address] [place], [place] [id number]

## 2017-07-21 DIAGNOSIS — R079 Chest pain, unspecified: Secondary | ICD-10-CM | POA: Insufficient documentation

## 2017-07-21 DIAGNOSIS — R002 Palpitations: Secondary | ICD-10-CM | POA: Insufficient documentation

## 2017-12-22 ENCOUNTER — Ambulatory Visit: Payer: Managed Care, Other (non HMO) | Admitting: Neurology

## 2018-02-11 ENCOUNTER — Telehealth: Payer: Self-pay

## 2018-02-11 NOTE — Telephone Encounter (Signed)
SENT REFERRAL TO SCHEDULING FOR MONITOR FOR DR,PERINI [phone]

## 2018-03-01 ENCOUNTER — Other Ambulatory Visit: Payer: Self-pay | Admitting: Family Medicine

## 2018-03-01 DIAGNOSIS — R002 Palpitations: Secondary | ICD-10-CM

## 2018-03-11 ENCOUNTER — Ambulatory Visit: Payer: Managed Care, Other (non HMO) | Admitting: Neurology

## 2018-04-06 ENCOUNTER — Ambulatory Visit (INDEPENDENT_AMBULATORY_CARE_PROVIDER_SITE_OTHER): Payer: 59

## 2018-04-06 DIAGNOSIS — R002 Palpitations: Secondary | ICD-10-CM | POA: Diagnosis not present

## 2018-06-03 ENCOUNTER — Ambulatory Visit: Payer: Managed Care, Other (non HMO) | Admitting: Neurology

## 2018-09-09 ENCOUNTER — Encounter: Payer: Self-pay | Admitting: Neurology

## 2018-09-09 ENCOUNTER — Ambulatory Visit (INDEPENDENT_AMBULATORY_CARE_PROVIDER_SITE_OTHER): Payer: 59 | Admitting: Neurology

## 2018-09-09 VITALS — BP 144/84 | HR 55 | Ht 69.0 in | Wt 160.0 lb

## 2018-09-09 DIAGNOSIS — F5104 Psychophysiologic insomnia: Secondary | ICD-10-CM | POA: Diagnosis not present

## 2018-09-09 NOTE — Patient Instructions (Signed)

## 2018-09-09 NOTE — Progress Notes (Signed)
SLEEP MEDICINE CLINIC   Provider:  Larey Seat, M D   Referring Provider: Prince Solian, MD Primary Care Physician:  Prince Solian, MD  Chief Complaint  Patient presents with  . Follow-up    pt alone, rm 10. pt states that she is still having a hard time struggling with her sleep as she has been having for some time. DME [employer]. CPAP is working ok. she recently started the trazadone 2 wks ago to attempt helping with sleep. she states that it helps better then what she was on before.     HPI:  Patricia Rangel is a 62 y.o. female patient seen here as a revisit on 09-09-2018. She was originally referred by Dr. Dagmar Hait for a re-evaluation of sleep apnea, but now presents with insomnia and a possibly non working CPAP.  When I last met with Patricia Rangel she underwent an AutoSet re-titration and she is now using CPAP compliantly at 87% with an average use at time of 5 hours and 47 minutes, CPAP is set at 7 cmH2O with 3 cm expiratory pressure relief.  She has a residual apnea index of 4.4 of which 2.5 of central and 1.8 obstructive however his total AHI is satisfying. CPAP used compliantly since July 2018 improved her sleep overall but then she had developed more and more problems with insomnia.  There are some stressors.  There is a feeling of being wired- and thoughts intruding- unable to relax. OCD- ?    On August 25, 2018 Patricia Rangel saw her primary care physician and she again stated that she had trouble sleeping.  She has tried duloxetine-Cymbalta she was on Lexapro she had gotten a little bit of helped by alprazolam but the addictive potential meds is not a good choice.    Soc history: She doesn't drink alcohol, is a non smoker, and works a Sports coach. Her father passed away in July 2846 at 46 years of age , after a very prolonged illness, and also he did live in a Nursing home it was far away from her mother's residence and her own.  I have mentioned that the medications used to  treat insomnia are all Band-Aids, they did not cure her insomnia.  The underlying cause can be a momentary stressor or it can be a condition that has been there pretty much all her life.  So ruminating thoughts and intruding thoughts not allowing her to go to sleep are probably part of the insomnia problem.  I would like for her to implement also some extra sleep hygiene.  I would like for the patient to prepare for sleep at a certain time and give it at least 30 minutes of prep time before she goes to bed this should be her down time.  She should have a hot bath or shower-  should try to stay in bed no longer than 8 hours and no less than 7 hours.  All technology should be turned off 30 minutes before she goes to bed she should dim the lights and if she wants to reach she needs to reading a book was pages not on the ureter.  I recommend nonvocal music or a meditation tape in the background, sometimes a box fan is enough.  She should turn all clocks  AROUND looking at the clock is not helping sleep. 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 The above-named sleep habit changes should be kept in place for 2 weeks no naps in daytime,  getting up at the same time every day- weekday and weekend, going to bed every day at the same time -weekend and weekday. Usually 14 days are enough to consolidate sleep.  Melatonin, tryptophane , valerian-  All are allowed as over-the-counter sleep aids, she may continue Lexapro and Trazodone if she chooses.    Interval history from 06/18/2017. I have the pleasure of seeing Patricia Rangel today, who underwent a home sleep test on 09/02/2016, which diagnosed her with an AHI of 17.4, RDI of 21.1 but in significant oxygen desaturations. Only 6 minutes of total sleep time were spent at or below 88%  oxygen saturation. The main concern was that this patient had irregular heartbeats with the fastest being 205 bpm and the slowest 48 bpm. For this reason she was not considered a candidate for dental device but had to return for CPAP titration. CPAP titration took place on 10/14/2016 and reduced her AHI to 0.2 at a pressure of only 7 cm water pressure she also used a nasal pillow in medium size 8 by [employer] fit P 10 . I'm able to review a CPAP download with 100% compliance and an average user time of 6 hours and 3 minutes, the patient uses CPAP at 7 cm water pressure with a 3 cm expiratory pressure relief function. Residual AHI is 2.7 which is ideal, she does have minimal air leaks there is no evidence of patient's change any setting . Patricia Rangel still experiences occasional jolting arousals, she will be awake for up to 20 minutes trying to go back to sleep. These arousals a scary. She does not experience shortness of breath and her headaches have improved since she uses CPAP she certainly is no longer snoring. The question remains if she still has tachycardia and bradycardia spells but could culminate in such a jolt. She sleeps still in intervals of 90-120 minutes. Anxiety?   Chief complaint according to patient : "I cannot longer sleep in 2 hours en bloc", "  I wake up with a severe startle, and this will evolve into a severe headache" I have seen Patricia Rangel almost 4 years ago for a sleep evaluation she had a history of obstructive sleep apnea with prior CPAP use through the year 2009 but was unable to continue using CPAP. She presented at that  time with sleep complains of excessive daytime sleepiness waking with a dry mouth very fragmented sleep with morning headache and nocturia. I had ordered a split-night polysomnography which was performed on 02/16/2013 and showed in the baseline diagnostic part an AHI of 24.3 per hour of sleep and an RDI of 28.2. During REM sleep her apnea exacerbated to 81.6 per  hour there was no supine accentuation. Oxygen nadir was 81% but only 9.8 minutes of total desaturation time were seen.  Sleep habits are as follows:She aims for a bedtime of 10 PM, she has a white noise machine in her bedroom. She has tried lavender oil in the diffusor. Nonetheless she seems to wake up every hour on the hour.The early morning hours she will have a feeling of a band like  tightness around the head , she usually tries to leave the bed and bedroom and walk. It seems to be around 2:30 AM that her headaches do wake her and hinder her to go  back to sleep. If she can go back to sleep she may wake up with her even more severe headache the next time.She rises at 6.30 AM. She is usually already awake for hours. She never feels rested, but is not sleepy. She may take a nap in daytime, feels fatigued- but is not able to go to sleep.   Sleep medical history and family sleep history:  Insomnia for over 2 Years,  Tonsillectomy at age 21, with prolonged recovery time. No TBI or neck surgeries. Childhood  history of sleep walking. No enuresis no night terrors.  One daughter is a sleep walker.    Social history:  Married mother of 1, now adult daughters. Her daughters were involved in a severe car accident.   She is a lifelong nonsmoker and does not use any other form of tobacco, she does not drink alcohol, she drinks 1-2 cups of coffee in the morning, ice tea 2 glasses a day as well , but not soda.   Review of Systems:  Out of a complete 14 system review, the patient complains of only the following symptoms, and all other reviewed systems are negative. History of moderate OSA , not feeling better and not having less headaches while on CPAP at 6 cm water- HA improved on only 7 cm water.   Epworth sleepiness score 4 of 24 points , Fatigue severity score 18 down from 52 pre CPAP  , depression score 1/15    Social History   Socioeconomic History  . Marital status: Single    Spouse name: Not on  file  . Number of children: Not on file  . Years of education: Not on file  . Highest education level: Not on file  Occupational History  . Not on file  Social Needs  . Financial resource strain: Not on file  . Food insecurity:    Worry: Not on file    Inability: Not on file  . Transportation needs:    Medical: Not on file    Non-medical: Not on file  Tobacco Use  . Smoking status: Never Smoker  . Smokeless tobacco: Never Used  Substance and Sexual Activity  . Alcohol use: No  . Drug use: No  . Sexual activity: Not on file  Lifestyle  . Physical activity:    Days per week: Not on file    Minutes per session: Not on file  . Stress: Not on file  Relationships  . Social connections:    Talks on phone: Not on file    Gets together: Not on file    Attends religious service: Not on file    Active member of club or organization: Not on file    Attends meetings of clubs or organizations: Not on file    Relationship status: Not on file  . Intimate partner violence:    Fear of current or ex partner: Not on file    Emotionally abused: Not on file    Physically abused: Not on file    Forced sexual activity: Not on file  Other Topics Concern  . Not on file  Social History Narrative  . Not on file    Family History  Problem Relation Age of Onset  . Hypertension Mother   . Thyroid disease Mother   . Hypercholesterolemia Mother   . Diabetes Father   . Hypertension Father   . Colon cancer Neg Hx     Past Medical History:  Diagnosis Date  . Basal cell carcinoma   .  Headache   . Hypertension   . Mitral valve prolapse   . Sleep apnea    cpap  . SVT (supraventricular tachycardia) (HCC)     Past Surgical History:  Procedure Laterality Date  . BACK SURGERY  1998  . ROOT CANAL    . TONSILLECTOMY  1994    Current Outpatient Medications  Medication Sig Dispense Refill  . escitalopram (LEXAPRO) 5 MG tablet Take 5 mg by mouth at bedtime.  6  . traZODone (DESYREL) 50 MG  tablet Take 50 mg by mouth at bedtime.  6   No current facility-administered medications for this visit.     Allergies as of 09/09/2018 - Review Complete 09/09/2018  Allergen Reaction Noted  . Penicillins Hives 08/26/2013  . Sulfa antibiotics Hives 08/26/2013    Vitals: BP (!) 144/84   Pulse (!) 55   Ht 5' 9" (1.753 m)   Wt 160 lb (72.6 kg)   BMI 23.63 kg/m  Last Weight:  Wt Readings from Last 1 Encounters:  09/09/18 160 lb (72.6 kg)   MWU:XLKG mass index is 23.63 kg/m.     Last Height:   Ht Readings from Last 1 Encounters:  09/09/18 5' 9" (1.753 m)    Physical exam:  General: The patient is awake, alert and appears not in acute distress. The patient is well groomed. Head: Normocephalic, atraumatic. Neck is supple. Mallampati 2,  neck circumference:14.25 .  Nasal airflow patent , TMJ is not evident . Retrognathia is seen.  Cardiovascular:  Regular rate and rhythm, without  murmurs or carotid bruit, and without distended neck veins. Respiratory: Lungs are clear to auscultation.    Neurologic exam :The patient is awake and alert, oriented to place and time.   Memory subjective described as intact.  Attention span & concentration ability appears normal.  Speech is fluent, without dysarthria, talkative are appropriate.  Cranial nerves: Pupils are equal and briskly reactive to light. Visual fields by finger perimetry are intact. Hearing to finger rub intact.   Facial sensation intact to fine touch. Facial motor strength is symmetric and tongue and uvula move midline. Shoulder shrug was symmetrical.   Motor exam: Normal tone, muscle bulk and symmetric strength in all extremities. Sensory:  Fine touch, pinprick and vibration was normal. Coordination: Finger-to-nose maneuver  normal without evidence of ataxia, dysmetria or tremor. Gait and station: Patient walks without assistive device. Deep tendon reflexes: in th  upper and lower extremities are symmetric at 2 plus and  intact.  The patient was advised of the nature of the diagnosed sleep disorder , the treatment options and risks for general a health and wellness arising from not treating the condition. I spent more than 25  minutes of face to face time with the patient. Greater than 50% of time was spent in counseling and coordination of care. We have discussed the diagnosis and differential and I answered the patient's questions.     Assessment:  After physical and neurologic examination, review of laboratory studies,  Personal review of imaging studies, reports of other /same  Imaging studies ,  Results of polysomnography/ neurophysiology testing and pre-existing records as far as provided in visit., my assessment is    1 ) OSA - on low pressure CPAP ; improved over all sleepiness and fatigue , no headaches !!!   2) insomnia-not struggling going to sleep. It is a problem to stay asleep.  I still think anxiety plays a role- perhaps OCD . Improve sleep hygiene- reduce anxiety ,  establish routines.   3) no more palpitations, cardiac monitor was negative ( Dr. Dagmar Hait )   Plan:  Treatment plan and additional workup : I am fairly satisfied with the patient's response to CPAP.  Insomnia treatment will be behavior modification, she received the 14 days boot camp instructions.   Prn revisit or yearly CPAP compliance.Asencion Partridge Dohmeier MD  09/09/2018   CC: Prince Solian, [address] [place], [place] [id number]

## 2018-09-28 ENCOUNTER — Ambulatory Visit (INDEPENDENT_AMBULATORY_CARE_PROVIDER_SITE_OTHER): Payer: 59 | Admitting: Podiatry

## 2018-09-28 ENCOUNTER — Encounter: Payer: Self-pay | Admitting: Podiatry

## 2018-09-28 VITALS — BP 120/70

## 2018-09-28 DIAGNOSIS — L600 Ingrowing nail: Secondary | ICD-10-CM | POA: Diagnosis not present

## 2018-09-28 DIAGNOSIS — M722 Plantar fascial fibromatosis: Secondary | ICD-10-CM | POA: Diagnosis not present

## 2018-09-28 MED ORDER — NEOMYCIN-POLYMYXIN-HC 1 % OT SOLN: OTIC | 1 refills | Status: DC

## 2018-09-28 NOTE — Progress Notes (Signed)
  Subjective:  Patient ID: Patricia Rangel, female    DOB: 16-Jul-1956,  MRN: [id number] HPI Chief Complaint  Patient presents with  . Ingrown Toenail    great toenail ingrown Right foot lateral side.   . Nail Problem    Discoloration in the right toenail 4th toe. Going on since the summertime    62 y.o. female presents with the above complaint.   ROS: Denies fever chills nausea vomiting muscle aches pains calf pain back pain chest pain shortness of breath.  Past Medical History:  Diagnosis Date  . Basal cell carcinoma   . Headache   . Hypertension   . Mitral valve prolapse   . Sleep apnea    cpap  . SVT (supraventricular tachycardia) (HCC)    Past Surgical History:  Procedure Laterality Date  . BACK SURGERY  1998  . ROOT CANAL    . TONSILLECTOMY  1994    Current Outpatient Medications:  .  escitalopram (LEXAPRO) 5 MG tablet, Take 5 mg by mouth at bedtime., Disp: , Rfl: 6 .  traZODone (DESYREL) 50 MG tablet, Take 50 mg by mouth at bedtime., Disp: , Rfl: 6 .  NEOMYCIN-POLYMYXIN-HYDROCORTISONE (CORTISPORIN) 1 % SOLN OTIC solution, Apply 1-2 drops to toe BID after soaking, Disp: 10 mL, Rfl: 1  Allergies  Allergen Reactions  . Penicillins Hives  . Sulfa Antibiotics Hives   Review of Systems Objective:   Vitals:   09/28/18 1012  BP: 120/70    General: Well developed, nourished, in no acute distress, alert and oriented x3   Dermatological: Skin is warm, dry and supple bilateral. Nails x 10 are well maintained; remaining integument appears unremarkable at this time. There are no open sores, no preulcerative lesions, no rash or signs of infection present.  Sharp incurvated nail margin fibular border hallux right exquisitely painful on palpation with mild erythema no purulence no malodor.  Vascular: Dorsalis Pedis artery and Posterior Tibial artery pedal pulses are 2/4 bilateral with immedate capillary fill time. Pedal hair growth present. No varicosities and no lower  extremity edema present bilateral.   Neruologic: Grossly intact via light touch bilateral. Vibratory intact via tuning fork bilateral. Protective threshold with Semmes Wienstein monofilament intact to all pedal sites bilateral. Patellar and Achilles deep tendon reflexes 2+ bilateral. No Babinski or clonus noted bilateral.   Musculoskeletal: No gross boney pedal deformities bilateral. No pain, crepitus, or limitation noted with foot and ankle range of motion bilateral. Muscular strength 5/5 in all groups tested bilateral.  She has mild tenderness on palpation medial calcaneal tubercle left.  Gait: Unassisted, Nonantalgic.    Radiographs:  None taken  Assessment & Plan:   Assessment: Ingrown toenail fibular border hallux right plantar fasciitis left  Plan: Chemical matrixectomy was performed to the fibular border hallux right today after 3 cc of a 50-50 mixture of Marcaine plain lidocaine plain was infiltrated in a hallux block.  Tolerated procedure well without complications.  She was provided with both oral and written home-going instructions for the care and soaking of her toe as well as a prescription for Cortisporin Otic to be applied twice daily after soaking.  I will follow-up with her for check in 2 to 3 weeks.  We dispensed an L 1902.     Dayzha Pogosyan T. [place], [place]

## 2018-09-28 NOTE — Patient Instructions (Signed)

## 2018-10-11 ENCOUNTER — Telehealth: Payer: Self-pay | Admitting: *Deleted

## 2018-10-11 MED ORDER — DOXYCYCLINE HYCLATE 100 MG PO TABS: 100.0000 mg | ORAL_TABLET | Freq: Two times a day (BID) | ORAL | 0 refills | Status: DC

## 2018-10-11 NOTE — Telephone Encounter (Signed)
Patient called stating her toe was looking red from ingrown procedure. She has an appt tomorrow to check it, but was concerned and wanted to call prior to visit. I returned call and advised she continue soaking and using drops and also I would call her in an antibiotic to her pharmacy ([facility]). She wanted to cancel appt for tomorrow and see if this helps and will follow up if she feels it is no better.

## 2018-10-12 ENCOUNTER — Other Ambulatory Visit: Payer: 59

## 2018-10-26 ENCOUNTER — Ambulatory Visit: Payer: 59 | Admitting: Podiatry

## 2018-12-08 DIAGNOSIS — J101 Influenza due to other identified influenza virus with other respiratory manifestations: Secondary | ICD-10-CM | POA: Diagnosis not present

## 2018-12-08 DIAGNOSIS — J029 Acute pharyngitis, unspecified: Secondary | ICD-10-CM | POA: Diagnosis not present

## 2018-12-13 DIAGNOSIS — J309 Allergic rhinitis, unspecified: Secondary | ICD-10-CM | POA: Diagnosis not present

## 2018-12-13 DIAGNOSIS — I10 Essential (primary) hypertension: Secondary | ICD-10-CM | POA: Diagnosis not present

## 2018-12-13 DIAGNOSIS — J209 Acute bronchitis, unspecified: Secondary | ICD-10-CM | POA: Diagnosis not present

## 2018-12-13 DIAGNOSIS — R05 Cough: Secondary | ICD-10-CM | POA: Diagnosis not present

## 2018-12-20 DIAGNOSIS — J209 Acute bronchitis, unspecified: Secondary | ICD-10-CM | POA: Diagnosis not present

## 2018-12-20 DIAGNOSIS — M791 Myalgia, unspecified site: Secondary | ICD-10-CM | POA: Diagnosis not present

## 2018-12-20 DIAGNOSIS — I10 Essential (primary) hypertension: Secondary | ICD-10-CM | POA: Diagnosis not present

## 2019-01-12 DIAGNOSIS — L570 Actinic keratosis: Secondary | ICD-10-CM | POA: Diagnosis not present

## 2019-01-12 DIAGNOSIS — B078 Other viral warts: Secondary | ICD-10-CM | POA: Diagnosis not present

## 2019-01-12 DIAGNOSIS — Z85828 Personal history of other malignant neoplasm of skin: Secondary | ICD-10-CM | POA: Diagnosis not present

## 2019-01-12 DIAGNOSIS — Z08 Encounter for follow-up examination after completed treatment for malignant neoplasm: Secondary | ICD-10-CM | POA: Diagnosis not present

## 2019-01-12 DIAGNOSIS — L720 Epidermal cyst: Secondary | ICD-10-CM | POA: Diagnosis not present

## 2019-09-19 ENCOUNTER — Ambulatory Visit: Payer: 59 | Admitting: Neurology

## 2019-12-13 ENCOUNTER — Ambulatory Visit (INDEPENDENT_AMBULATORY_CARE_PROVIDER_SITE_OTHER): Payer: BLUE CROSS/BLUE SHIELD | Admitting: Neurology

## 2019-12-13 ENCOUNTER — Encounter: Payer: Self-pay | Admitting: Neurology

## 2019-12-13 ENCOUNTER — Other Ambulatory Visit: Payer: Self-pay

## 2019-12-13 VITALS — BP 140/86 | HR 73 | Temp 97.7°F | Ht 69.0 in | Wt 163.0 lb

## 2019-12-13 DIAGNOSIS — G44019 Episodic cluster headache, not intractable: Secondary | ICD-10-CM

## 2019-12-13 DIAGNOSIS — F518 Other sleep disorders not due to a substance or known physiological condition: Secondary | ICD-10-CM | POA: Diagnosis not present

## 2019-12-13 DIAGNOSIS — R079 Chest pain, unspecified: Secondary | ICD-10-CM

## 2019-12-13 DIAGNOSIS — F5104 Psychophysiologic insomnia: Secondary | ICD-10-CM

## 2019-12-13 DIAGNOSIS — G4733 Obstructive sleep apnea (adult) (pediatric): Secondary | ICD-10-CM | POA: Diagnosis not present

## 2019-12-13 DIAGNOSIS — R002 Palpitations: Secondary | ICD-10-CM | POA: Diagnosis not present

## 2019-12-13 DIAGNOSIS — G44009 Cluster headache syndrome, unspecified, not intractable: Secondary | ICD-10-CM | POA: Insufficient documentation

## 2019-12-13 MED ORDER — BUSPIRONE HCL 5 MG PO TABS: ORAL_TABLET | ORAL | 5 refills | Status: DC

## 2019-12-13 NOTE — Patient Instructions (Signed)
Buspirone tablets What is this medicine? BUSPIRONE (byoo SPYE rone) is used to treat anxiety disorders. This medicine may be used for other purposes; ask your health care provider or pharmacist if you have questions. COMMON BRAND NAME(S): BuSpar What should I tell my health care provider before I take this medicine? They need to know if you have any of these conditions:  kidney or liver disease  an unusual or allergic reaction to buspirone, other medicines, foods, dyes, or preservatives  pregnant or trying to get pregnant  breast-feeding How should I use this medicine? Take this medicine by mouth with a glass of water. Follow the directions on the prescription label. You may take this medicine with or without food. To ensure that this medicine always works the same way for you, you should take it either always with or always without food. Take your doses at regular intervals. Do not take your medicine more often than directed. Do not stop taking except on the advice of your doctor or health care professional. Talk to your pediatrician regarding the use of this medicine in children. Special care may be needed. Overdosage: If you think you have taken too much of this medicine contact a poison control center or emergency room at once. NOTE: This medicine is only for you. Do not share this medicine with others. What if I miss a dose? If you miss a dose, take it as soon as you can. If it is almost time for your next dose, take only that dose. Do not take double or extra doses. What may interact with this medicine? Do not take this medicine with any of the following medications:  linezolid  MAOIs like Carbex, Eldepryl, Marplan, Nardil, and Parnate  methylene blue  procarbazine This medicine may also interact with the following medications:  diazepam  digoxin  diltiazem  erythromycin  grapefruit juice  haloperidol  medicines for mental depression or mood problems  medicines  for seizures like carbamazepine, phenobarbital and phenytoin  nefazodone  other medications for anxiety  rifampin  ritonavir  some antifungal medicines like itraconazole, ketoconazole, and voriconazole  verapamil  warfarin This list may not describe all possible interactions. Give your health care provider a list of all the medicines, herbs, non-prescription drugs, or dietary supplements you use. Also tell them if you smoke, drink alcohol, or use illegal drugs. Some items may interact with your medicine. What should I watch for while using this medicine? Visit your doctor or health care professional for regular checks on your progress. It may take 1 to 2 weeks before your anxiety gets better. You may get drowsy or dizzy. Do not drive, use machinery, or do anything that needs mental alertness until you know how this drug affects you. Do not stand or sit up quickly, especially if you are an older patient. This reduces the risk of dizzy or fainting spells. Alcohol can make you more drowsy and dizzy. Avoid alcoholic drinks. What side effects may I notice from receiving this medicine? Side effects that you should report to your doctor or health care professional as soon as possible:  blurred vision or other vision changes  chest pain  confusion  difficulty breathing  feelings of hostility or anger  muscle aches and pains  numbness or tingling in hands or feet  ringing in the ears  skin rash and itching  vomiting  weakness Side effects that usually do not require medical attention (report to your doctor or health care professional if they continue or   are bothersome):  disturbed dreams, nightmares  headache  nausea  restlessness or nervousness  sore throat and nasal congestion  stomach upset This list may not describe all possible side effects. Call your doctor for medical advice about side effects. You may report side effects to FDA at 1-800-FDA-1088. Where should I  keep my medicine? Keep out of the reach of children. Store at room temperature below 30 degrees C (86 degrees F). Protect from light. Keep container tightly closed. Throw away any unused medicine after the expiration date. NOTE: This sheet is a summary. It may not cover all possible information. If you have questions about this medicine, talk to your doctor, pharmacist, or health care provider.  2020 Elsevier/Gold Standard (2010-07-11 18:06:11)  

## 2019-12-13 NOTE — Progress Notes (Signed)
[facility]   Provider:  Larey Seat, M D   Referring Provider: Prince Solian, MD Primary Care Physician:  Prince Solian, MD  Chief Complaint  Patient presents with   Follow-up    pt alone, rm 11. pt states that per last visit she stopped taking medication and also stopped CPAP and she still struggles with sleep. she states that she only gets 1-2 hr asleep at a time before she is up. she restarted CPAP recently and she states it does help some but she still wakes up frequently and doesnt get sufficient sleep. DME [facility]    RV 12-13-2019:  Patricia Rangel is a 63 y.o. female patient seen here for insomnia- and CPAP problems. Refinance has for years woken up at 3 AM reflecting the demands of her work schedule.  She stopped taking sleep aids but also stopped her CPAP but continued to struggle somewhat with sleep.  She feels that she only gets 1 to 2 hours of sleep at a time.  She developed  headaches, cluster headaches that second her because nausea.  CPAP did not change the HA pattern.  On the days that she wakes up with a headache it usually resolves by 5 PM.  She is restarted CPAP only recently and it does help her with some sleep but it still does not feel that her sleep is sufficient and it remains fragmented.  A download from her CPAP machine is available starting on 16 December and allowing therefore only an 11-day review or 12-day review each of these 12 days the patient uses CPAP 7 hours and 42 minutes on average, her set pressure of 7 cmH2O was forced with 3 cm EPR and her residual apneas are interestingly 50% central and 50% obstructive, she does not have major air leaks.  The central apnea index is 2.7/h on the obstructive 2.3/h. FSS 27, How likely are you to doze in the following situations: 0 = not likely, 1 = slight chance, 2 = moderate chance, 3 = high chance  Sitting and Reading? Watching Television? Sitting inactive in a public place (theater or  meeting)? Lying down in the afternoon when circumstances permit? 1 Sitting and talking to someone? Sitting quietly after lunch without alcohol? In a car, while stopped for a few minutes in traffic? As a passenger in a car for an hour without a break?  Total = 1/ 24    Revisit on 09-09-2018. She was originally referred by Dr. Dagmar Hait for a re-evaluation of sleep apnea, but now presents with insomnia and a possibly non working CPAP.  When I last met with Patricia Rangel she underwent an AutoSet re-titration and she is now using CPAP compliantly at 87% with an average use at time of 5 hours and 47 minutes, CPAP is set at 7 cmH2O with 3 cm expiratory pressure relief.  She has a residual apnea index of 4.4 of which 2.5 of central and 1.8 obstructive however his total AHI is satisfying. CPAP used compliantly since July 2018 improved her sleep overall but then she had developed more and more problems with insomnia.  There are some stressors.  There is a feeling of being wired- and thoughts intruding- unable to relax. OCD- ?    On August 25, 2018-  Patricia Rangel saw her primary care physician and she again stated that she had trouble sleeping.  She has tried duloxetine-Cymbalta she was on Lexapro she had gotten a little bit of helped by alprazolam but the  addictive potential meds is not a good choice.    Soc history: She doesn't drink alcohol, is a non smoker, and works a Sports coach. Her father passed away in July 7726 at 31 years of age , after a very prolonged illness, and also he did live in a Nursing home it was far away from her mother's residence and her own.  I have mentioned that the medications used to treat insomnia are all Band-Aids, they did not cure her insomnia.  The underlying cause can be a momentary stressor or it can be a condition that has been there pretty much all her life.  So ruminating thoughts and intruding thoughts not allowing her to go to sleep are probably part of the insomnia  problem.  I would like for her to implement also some extra sleep hygiene.  I would like for the patient to prepare for sleep at a certain time and give it at least 30 minutes of prep time before she goes to bed this should be her down time.  She should have a hot bath or shower-  should try to stay in bed no longer than 8 hours and no less than 7 hours.  All technology should be turned off 30 minutes before she goes to bed she should dim the lights and if she wants to reach she needs to reading a book was pages not on the ureter.  I recommend nonvocal music or a meditation tape in the background, sometimes a box fan is enough.  She should turn all clocks  AROUND looking at the clock is not helping sleep. 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 The above-named sleep habit changes should be kept in place for 2 weeks no naps in daytime,  getting up at the same time every day- weekday and weekend, going to bed every day at the same time -weekend and weekday. Usually 14 days are enough to consolidate sleep.  Melatonin, tryptophane , valerian-  All are allowed as over-the-counter sleep aids, she may continue Lexapro and Trazodone if she chooses.    Interval history from 06/18/2017. I have the pleasure of seeing Patricia Rangel today, who underwent a home sleep test on 09/02/2016, which diagnosed her with an AHI of 17.4, RDI of 21.1 but in significant oxygen desaturations. Only 6 minutes of total sleep time were spent at or below 88% oxygen saturation. The main concern was that this patient had irregular heartbeats with the fastest being 205 bpm and the slowest 48 bpm. For this reason she was not considered a candidate for dental device but had to return for CPAP titration. CPAP titration took place on 10/14/2016 and reduced her AHI  to 0.2 at a pressure of only 7 cm water pressure she also used a nasal pillow in medium size 8 by [employer] fit P 10 . I'm able to review a CPAP download with 100% compliance and an average user time of 6 hours and 3 minutes, the patient uses CPAP at 7 cm water pressure with a 3 cm expiratory pressure relief function. Residual AHI is 2.7 which is ideal, she does have minimal air leaks there is no evidence of patient's change any setting . Mrs. Garry still experiences occasional jolting arousals, she will be awake for up to 20 minutes trying  to go back to sleep. These arousals a scary. She does not experience shortness of breath and her headaches have improved since she uses CPAP she certainly is no longer snoring. The question remains if she still has tachycardia and bradycardia spells but could culminate in such a jolt. She sleeps still in intervals of 90-120 minutes. Anxiety?   Chief complaint according to patient : "I cannot longer sleep in 2 hours en bloc", "  I wake up with a severe startle, and this will evolve into a severe headache" I have seen Mrs. Pepin almost 4 years ago for a sleep evaluation she had a history of obstructive sleep apnea with prior CPAP use through the year 2009 but was unable to continue using CPAP. She presented at that  time with sleep complains of excessive daytime sleepiness waking with a dry mouth very fragmented sleep with morning headache and nocturia. I had ordered a split-night polysomnography which was performed on 02/16/2013 and showed in the baseline diagnostic part an AHI of 24.3 per hour of sleep and an RDI of 28.2. During REM sleep her apnea exacerbated to 81.6 per hour there was no supine accentuation. Oxygen nadir was 81% but only 9.8 minutes of total desaturation time were seen.  Sleep habits are as follows:She aims for a bedtime of 10 PM, she has a white noise machine in her bedroom. She has tried lavender oil in the diffusor. Nonetheless she seems to wake up  every hour on the hour.The early morning hours she will have a feeling of a band like  tightness around the head , she usually tries to leave the bed and bedroom and walk. It seems to be around 2:30 AM that her headaches do wake her and hinder her to go back to sleep. If she can go back to sleep she may wake up with her even more severe headache the next time.She rises at 6.30 AM. She is usually already awake for hours. She never feels rested, but is not sleepy. She may take a nap in daytime, feels fatigued- but is not able to go to sleep.   Sleep medical history and family sleep history:  Insomnia for over 2 Years,  Tonsillectomy at age 74, with prolonged recovery time. No TBI or neck surgeries. Childhood  history of sleep walking. No enuresis no night terrors.  One daughter is a sleep walker.    Social history:  Married mother of 45, now adult daughters. Her daughters were involved in a severe car accident.   She is a lifelong nonsmoker and does not use any other form of tobacco, she does not drink alcohol, she drinks 1-2 cups of coffee in the morning, ice tea 2 glasses a day as well , but not soda.   Review of Systems:  Out of a complete 14 system review, the patient complains of only the following symptoms, and all other reviewed systems are negative. History of moderate OSA , not feeling better and not having less headaches while on CPAP at 6 cm water- HA improved on only 7 cm water.   Epworth sleepiness score 4 of 24 points , Fatigue severity score 18 down from 52 pre CPAP  , depression score 1/15    Social History   Socioeconomic History   Marital status: Single    Spouse name: Not on file   Number of children: Not on file   Years of education: Not on file   Highest education level: Not on file  Occupational  History   Not on file  Tobacco Use   Smoking status: Never Smoker   Smokeless tobacco: Never Used  Substance and Sexual Activity   Alcohol use: No   Drug use: No    Sexual activity: Not on file  Other Topics Concern   Not on file  Social History Narrative   Not on file   Social Determinants of Health   Financial Resource Strain:    Difficulty of Paying Living Expenses: Not on file  Food Insecurity:    Worried About [place] in the Last Year: Not on file   Ran Out of Food in the Last Year: Not on file  Transportation Needs:    Lack of Transportation (Medical): Not on file   Lack of Transportation (Non-Medical): Not on file  Physical Activity:    Days of Exercise per Week: Not on file   Minutes of Exercise per Session: Not on file  Stress:    Feeling of Stress : Not on file  Social Connections:    Frequency of Communication with Friends and Family: Not on file   Frequency of Social Gatherings with Friends and Family: Not on file   Attends Religious Services: Not on file   Active Member of Clubs or Organizations: Not on file   Attends Archivist Meetings: Not on file   Marital Status: Not on file  Intimate Partner Violence:    Fear of Current or Ex-Partner: Not on file   Emotionally Abused: Not on file   Physically Abused: Not on file   Sexually Abused: Not on file    Family History  Problem Relation Age of Onset   Hypertension Mother    Thyroid disease Mother    Hypercholesterolemia Mother    Diabetes Father    Hypertension Father    Colon cancer Neg Hx     Past Medical History:  Diagnosis Date   Basal cell carcinoma    Headache    Hypertension    Mitral valve prolapse    Sleep apnea    cpap   SVT (supraventricular tachycardia) ([place])     Past Surgical History:  Procedure Laterality Date   [place]    No current outpatient medications on file.   No current facility-administered medications for this visit.    Allergies as of 12/13/2019 - Review Complete 12/13/2019  Allergen Reaction Noted   Penicillins  Hives 08/26/2013   Sulfa antibiotics Hives 08/26/2013    Vitals: BP 140/86    Pulse 73    Temp 97.7 F (36.5 C)    Ht 5' 9"  (1.753 m)    Wt 163 lb (73.9 kg)    BMI 24.07 kg/m  Last Weight:  Wt Readings from Last 1 Encounters:  12/13/19 163 lb (73.9 kg)   NOB:SJGG mass index is 24.07 kg/m.     Last Height:   Ht Readings from Last 1 Encounters:  12/13/19 5' 9"  (1.753 m)    Physical exam:  General: The patient is awake, alert and appears not in acute distress. The patient is well groomed. Head: Normocephalic, atraumatic. Neck is supple. Mallampati 2,  neck circumference:14.25 .  Nasal airflow patent , TMJ is not evident . Retrognathia is seen.  Cardiovascular:  Regular rate and rhythm, without  murmurs or carotid bruit, and without distended neck veins. Respiratory: Lungs are clear to auscultation.    Neurologic exam :The patient is  awake and alert, oriented to place and time.   Memory subjective described as intact.  Attention span & concentration ability appears normal.  Speech is fluent, without dysarthria, talkative are appropriate.  Cranial nerves: Pupils are equal and briskly reactive to light. Visual fields by finger perimetry are intact. Hearing to finger rub intact.   Facial sensation intact to fine touch. Facial motor strength is symmetric and tongue and uvula move midline. Shoulder shrug was symmetrical.   Motor exam: Normal tone, muscle bulk and symmetric strength in all extremities. Sensory:  Fine touch, pinprick and vibration was normal. Coordination: Finger-to-nose maneuver  normal without evidence of ataxia, dysmetria or tremor. Gait and station: Patient walks without assistive device. Deep tendon reflexes: in th  upper and lower extremities are symmetric at 2 plus and intact.  The patient was advised of the nature of the diagnosed sleep disorder , the treatment options and risks for general a health and wellness arising from not treating the condition. I spent  more than 25  minutes of face to face time with the patient. Greater than 50% of time was spent in counseling and coordination of care. We have discussed the diagnosis and differential and I answered the patient's questions.     Assessment:  After physical and neurologic examination, review of laboratory studies,  Personal review of imaging studies, reports of other /same  Imaging studies ,  Results of polysomnography/ neurophysiology testing and pre-existing records as far as provided in visit., my assessment is    1 ) OSA - on low pressure CPAP ; improved over all sleepiness and fatigue , no headaches !!!   2) insomnia-not struggling going to sleep. It is a problem to stay asleep.  I still think anxiety plays a role- perhaps OCD . Improve sleep hygiene- reduce anxiety , establish routines.   3) no more palpitations, cardiac monitor was negative ( Dr. Dagmar Hait )   Plan:  Treatment plan and additional workup : I am fairly satisfied with the patient's response to CPAP.  Insomnia treatment will be behavior modification, she received the 14 days boot camp instructions.   The patient just restarted on CPAP on 11-30-2019 and is not overwhelmed by her headaches response.   I like for you to continue to use CPAP, new supplies will be ordered, compliance should be documented by 12-21-2019 again.   She is not keen on sleep aids.  She reports racing thoughts , which would be best treated by Buspar or SSRI.  I will give her a 30 day trial of buspar.   Asencion Partridge Mariany Mackintosh MD  12/13/2019   CC: Prince Solian, [address] [place],  [place] [id number]

## 2019-12-21 DIAGNOSIS — Z85828 Personal history of other malignant neoplasm of skin: Secondary | ICD-10-CM | POA: Diagnosis not present

## 2019-12-21 DIAGNOSIS — L570 Actinic keratosis: Secondary | ICD-10-CM | POA: Diagnosis not present

## 2019-12-21 DIAGNOSIS — C4441 Basal cell carcinoma of skin of scalp and neck: Secondary | ICD-10-CM | POA: Diagnosis not present

## 2019-12-21 DIAGNOSIS — L0390 Cellulitis, unspecified: Secondary | ICD-10-CM | POA: Diagnosis not present

## 2020-01-02 DIAGNOSIS — Z79899 Other long term (current) drug therapy: Secondary | ICD-10-CM | POA: Diagnosis not present

## 2020-01-02 DIAGNOSIS — R7989 Other specified abnormal findings of blood chemistry: Secondary | ICD-10-CM | POA: Diagnosis not present

## 2020-01-02 DIAGNOSIS — Z0000 Encounter for general adult medical examination without abnormal findings: Secondary | ICD-10-CM | POA: Diagnosis not present

## 2020-01-02 DIAGNOSIS — I10 Essential (primary) hypertension: Secondary | ICD-10-CM | POA: Diagnosis not present

## 2020-01-09 DIAGNOSIS — R82998 Other abnormal findings in urine: Secondary | ICD-10-CM | POA: Diagnosis not present

## 2020-01-12 ENCOUNTER — Other Ambulatory Visit: Payer: Self-pay | Admitting: Neurology

## 2020-01-20 DIAGNOSIS — G43909 Migraine, unspecified, not intractable, without status migrainosus: Secondary | ICD-10-CM | POA: Diagnosis not present

## 2020-01-20 DIAGNOSIS — Z1331 Encounter for screening for depression: Secondary | ICD-10-CM | POA: Diagnosis not present

## 2020-01-20 DIAGNOSIS — G4730 Sleep apnea, unspecified: Secondary | ICD-10-CM | POA: Diagnosis not present

## 2020-01-20 DIAGNOSIS — R002 Palpitations: Secondary | ICD-10-CM | POA: Diagnosis not present

## 2020-01-20 DIAGNOSIS — I10 Essential (primary) hypertension: Secondary | ICD-10-CM | POA: Diagnosis not present

## 2020-01-20 DIAGNOSIS — Z0000 Encounter for general adult medical examination without abnormal findings: Secondary | ICD-10-CM | POA: Diagnosis not present

## 2020-01-27 DIAGNOSIS — M419 Scoliosis, unspecified: Secondary | ICD-10-CM | POA: Diagnosis not present

## 2020-01-27 DIAGNOSIS — M25511 Pain in right shoulder: Secondary | ICD-10-CM | POA: Diagnosis not present

## 2020-01-27 DIAGNOSIS — H6123 Impacted cerumen, bilateral: Secondary | ICD-10-CM | POA: Diagnosis not present

## 2020-01-30 DIAGNOSIS — J309 Allergic rhinitis, unspecified: Secondary | ICD-10-CM | POA: Diagnosis not present

## 2020-01-30 DIAGNOSIS — J020 Streptococcal pharyngitis: Secondary | ICD-10-CM | POA: Diagnosis not present

## 2020-01-30 DIAGNOSIS — Z1152 Encounter for screening for COVID-19: Secondary | ICD-10-CM | POA: Diagnosis not present

## 2020-02-04 DIAGNOSIS — G4733 Obstructive sleep apnea (adult) (pediatric): Secondary | ICD-10-CM | POA: Diagnosis not present

## 2020-02-09 DIAGNOSIS — Z13820 Encounter for screening for osteoporosis: Secondary | ICD-10-CM | POA: Diagnosis not present

## 2020-02-09 DIAGNOSIS — Z1231 Encounter for screening mammogram for malignant neoplasm of breast: Secondary | ICD-10-CM | POA: Diagnosis not present

## 2020-02-09 DIAGNOSIS — Z6824 Body mass index (BMI) 24.0-24.9, adult: Secondary | ICD-10-CM | POA: Diagnosis not present

## 2020-02-09 DIAGNOSIS — Z01419 Encounter for gynecological examination (general) (routine) without abnormal findings: Secondary | ICD-10-CM | POA: Diagnosis not present

## 2020-02-14 DIAGNOSIS — L301 Dyshidrosis [pompholyx]: Secondary | ICD-10-CM | POA: Diagnosis not present

## 2020-02-14 DIAGNOSIS — J029 Acute pharyngitis, unspecified: Secondary | ICD-10-CM | POA: Diagnosis not present

## 2020-02-14 DIAGNOSIS — J020 Streptococcal pharyngitis: Secondary | ICD-10-CM | POA: Diagnosis not present

## 2020-02-29 DIAGNOSIS — J029 Acute pharyngitis, unspecified: Secondary | ICD-10-CM | POA: Diagnosis not present

## 2020-03-23 DIAGNOSIS — H5203 Hypermetropia, bilateral: Secondary | ICD-10-CM | POA: Diagnosis not present

## 2020-03-23 DIAGNOSIS — H353131 Nonexudative age-related macular degeneration, bilateral, early dry stage: Secondary | ICD-10-CM | POA: Diagnosis not present

## 2020-06-11 ENCOUNTER — Ambulatory Visit: Payer: BLUE CROSS/BLUE SHIELD | Admitting: Adult Health

## 2020-10-09 DIAGNOSIS — M25511 Pain in right shoulder: Secondary | ICD-10-CM | POA: Diagnosis not present

## 2020-10-15 DIAGNOSIS — Z1152 Encounter for screening for COVID-19: Secondary | ICD-10-CM | POA: Diagnosis not present

## 2020-10-15 DIAGNOSIS — J020 Streptococcal pharyngitis: Secondary | ICD-10-CM | POA: Diagnosis not present

## 2020-10-15 DIAGNOSIS — R059 Cough, unspecified: Secondary | ICD-10-CM | POA: Diagnosis not present

## 2020-10-15 DIAGNOSIS — J029 Acute pharyngitis, unspecified: Secondary | ICD-10-CM | POA: Diagnosis not present

## 2020-11-15 ENCOUNTER — Ambulatory Visit (INDEPENDENT_AMBULATORY_CARE_PROVIDER_SITE_OTHER): Payer: BLUE CROSS/BLUE SHIELD | Admitting: Otolaryngology

## 2020-11-15 ENCOUNTER — Other Ambulatory Visit: Payer: Self-pay

## 2020-11-15 ENCOUNTER — Encounter (INDEPENDENT_AMBULATORY_CARE_PROVIDER_SITE_OTHER): Payer: Self-pay | Admitting: Otolaryngology

## 2020-11-15 VITALS — Temp 97.7°F

## 2020-11-15 DIAGNOSIS — J020 Streptococcal pharyngitis: Secondary | ICD-10-CM

## 2020-11-15 DIAGNOSIS — H6123 Impacted cerumen, bilateral: Secondary | ICD-10-CM

## 2020-11-15 NOTE — Progress Notes (Signed)
HPI: Patricia Rangel is a 64 y.o. female who presents is referred by by her PCP for evaluation of recurrent strep infections as well as to have her ears checked and cleaned.  She apparently had her tonsils removed about 15 years ago because of recurrent strep infections.  She still occasionally gets strep infections where she gets a sore throat and has had cultures positive for strep.  She is allergic to penicillin.  Z-Pak has not been efficient recently and most recently was treated with clindamycin and has responded well to this.  She has has no sore throat today no fever.  Past Medical History:  Diagnosis Date   Basal cell carcinoma    Headache    Hypertension    Mitral valve prolapse    Sleep apnea    cpap   SVT (supraventricular tachycardia) (HCC)    Past Surgical History:  Procedure Laterality Date   BACK SURGERY  1998   ROOT CANAL     TONSILLECTOMY  1994   Social History   Socioeconomic History   Marital status: Single    Spouse name: Not on file   Number of children: Not on file   Years of education: Not on file   Highest education level: Not on file  Occupational History   Not on file  Tobacco Use   Smoking status: Never Smoker   Smokeless tobacco: Never Used  Substance and Sexual Activity   Alcohol use: No   Drug use: No   Sexual activity: Not on file  Other Topics Concern   Not on file  Social History Narrative   Not on file   Social Determinants of Health   Financial Resource Strain:    Difficulty of Paying Living Expenses: Not on file  Food Insecurity:    Worried About [place] in the Last Year: Not on file   Ran Out of Food in the Last Year: Not on file  Transportation Needs:    Lack of Transportation (Medical): Not on file   Lack of Transportation (Non-Medical): Not on file  Physical Activity:    Days of Exercise per Week: Not on file   Minutes of Exercise per Session: Not on file  Stress:    Feeling of Stress  : Not on file  Social Connections:    Frequency of Communication with Friends and Family: Not on file   Frequency of Social Gatherings with Friends and Family: Not on file   Attends Religious Services: Not on file   Active Member of Clubs or Organizations: Not on file   Attends Archivist Meetings: Not on file   Marital Status: Not on file   Family History  Problem Relation Age of Onset   Hypertension Mother    Thyroid disease Mother    Hypercholesterolemia Mother    Diabetes Father    Hypertension Father    Colon cancer Neg Hx    Allergies  Allergen Reactions   Penicillins Hives   Sulfa Antibiotics Hives   Prior to Admission medications   Medication Sig Start Date End Date Taking? Authorizing Provider  busPIRone (BUSPAR) 5 MG tablet TAKE 1 TABLET BY MOUTH AS NEEDED AT BEDTIME 01/12/20  Yes Dohmeier, Asencion Partridge, MD     Positive ROS: Otherwise negative  All other systems have been reviewed and were otherwise negative with the exception of those mentioned in the HPI and as above.  Physical Exam: Constitutional: Alert, well-appearing, no acute distress Ears: External ears without lesions  or tenderness.  She has a mild amount of wax buildup in both ear canals that was cleaned with suction.  TMs were clear bilaterally. Nasal: External nose without lesions. Septum midline with mild rhinitis. Clear nasal passages otherwise. Oral: Lips and gums without lesions. Tongue and palate mucosa without lesions. Posterior oropharynx clear.  She is status post tonsillectomy with clear tonsillar fossa's bilaterally.  Indirect laryngoscopy revealed a clear base of tongue with minimal lingual tonsillar tissue and no exudate. Neck: No palpable adenopathy or masses Respiratory: Breathing comfortably  Skin: No facial/neck lesions or rash noted.  Cerumen impaction removal  Date/Time: 11/15/2020 2:08 PM Performed by: Rozetta Nunnery, MD Authorized by: Rozetta Nunnery,  MD   Consent:    Consent obtained:  Verbal   Consent given by:  Patient   Risks discussed:  Pain and bleeding Procedure details:    Location:  L ear and R ear   Procedure type: suction   Post-procedure details:    Inspection:  TM intact and canal normal   Hearing quality:  Improved   Patient tolerance of procedure:  Tolerated well, no immediate complications Comments:     TMs are clear bilaterally.    Assessment: History of recurrent strep pharyngitis with clear exam today. Cerumen buildup in both ear canals.  Plan: Reviewed with patient concerning options for treatment of recurrent strep. 1.  Is eliminate exposure and she is not sure where she is having exposure to strep.  #2 would be to improve the immune status and perhaps taken multivitamin or vitamin C daily may be beneficial. Outside of this the treatment with antibiotics when she has strep. She will follow-up as needed.  Ear canals were cleaned in the office today.   Radene Journey, MD   CC:

## 2021-01-29 DIAGNOSIS — R7989 Other specified abnormal findings of blood chemistry: Secondary | ICD-10-CM | POA: Diagnosis not present

## 2021-01-29 DIAGNOSIS — I10 Essential (primary) hypertension: Secondary | ICD-10-CM | POA: Diagnosis not present

## 2021-02-04 DIAGNOSIS — L578 Other skin changes due to chronic exposure to nonionizing radiation: Secondary | ICD-10-CM | POA: Diagnosis not present

## 2021-02-04 DIAGNOSIS — L820 Inflamed seborrheic keratosis: Secondary | ICD-10-CM | POA: Diagnosis not present

## 2021-02-04 DIAGNOSIS — L821 Other seborrheic keratosis: Secondary | ICD-10-CM | POA: Diagnosis not present

## 2021-02-04 DIAGNOSIS — L570 Actinic keratosis: Secondary | ICD-10-CM | POA: Diagnosis not present

## 2021-02-04 DIAGNOSIS — Z85828 Personal history of other malignant neoplasm of skin: Secondary | ICD-10-CM | POA: Diagnosis not present

## 2021-02-05 DIAGNOSIS — R82998 Other abnormal findings in urine: Secondary | ICD-10-CM | POA: Diagnosis not present

## 2021-02-05 DIAGNOSIS — J020 Streptococcal pharyngitis: Secondary | ICD-10-CM | POA: Diagnosis not present

## 2021-02-05 DIAGNOSIS — J309 Allergic rhinitis, unspecified: Secondary | ICD-10-CM | POA: Diagnosis not present

## 2021-02-05 DIAGNOSIS — J0100 Acute maxillary sinusitis, unspecified: Secondary | ICD-10-CM | POA: Diagnosis not present

## 2021-02-19 ENCOUNTER — Encounter: Payer: Self-pay | Admitting: Infectious Diseases

## 2021-02-19 ENCOUNTER — Other Ambulatory Visit (HOSPITAL_COMMUNITY)
Admission: RE | Admit: 2021-02-19 | Discharge: 2021-02-19 | Disposition: A | Discharge status: Home / Self Care | Payer: BLUE CROSS/BLUE SHIELD | Source: Ambulatory Visit | Attending: Infectious Diseases | Admitting: Infectious Diseases

## 2021-02-19 ENCOUNTER — Ambulatory Visit (INDEPENDENT_AMBULATORY_CARE_PROVIDER_SITE_OTHER): Payer: BLUE CROSS/BLUE SHIELD | Admitting: Infectious Diseases

## 2021-02-19 ENCOUNTER — Other Ambulatory Visit: Payer: Self-pay

## 2021-02-19 VITALS — BP 145/89 | HR 65 | Temp 98.0°F | Ht 69.0 in | Wt 148.0 lb

## 2021-02-19 DIAGNOSIS — Z113 Encounter for screening for infections with a predominantly sexual mode of transmission: Secondary | ICD-10-CM | POA: Insufficient documentation

## 2021-02-19 DIAGNOSIS — J020 Streptococcal pharyngitis: Secondary | ICD-10-CM | POA: Diagnosis not present

## 2021-02-19 LAB — CYTOLOGY, (ORAL, ANAL, URETHRAL) ANCILLARY ONLY
Chlamydia: NEGATIVE
Comment: NEGATIVE
Comment: NORMAL
Neisseria Gonorrhea: NEGATIVE

## 2021-02-19 NOTE — Progress Notes (Addendum)
[facility]                                                             [address], [place], [place], [address]                                                                  Phn. [phone]; Fax: [phone]                                                                             Date: 02/19/21  Reason for Referral: Recurrent streptococcal pharyngitis  Assessment/Plan 65 Year old female with PMH of sleep apnea, Basal cell carcinoma and  chronic carriage of streptococcus  throat who is referred for evaluation of recurrent streptococcus pharyngitis.  Pertinent Outside Work up  10/15/20 Strep positive, Flu and COVID 19 antigen negative  02/05/21 Strep positive, Flu negative, COVID 19 rapid antigen negative  2/22 UA unremarkable  02/05/21 CBC, CMP, Lipid panel and TSH unremarkable   It is unclear whether her chronic carriage of strep in her throat is actually causing her pharyngitis OR this could be viral pharyngitis in the background of chronic strep carrier OR there is something else. She does not have any oral lesions/exudates, ulcers or cervical lymphadenopathy on exam. No rashes.   Given her recurrence of pharyngitis in winter months along with some cough/rhinorrhea/no reported tender lymph nodes, I  favor this could be viral in origin and would not respond to antibiotics. She also seems to have a component of allergic rhinitis and sinusitis given her symptomatology of excessive runny nose/tearing of eyes, etc   Her history of penicillin allergy further complicated her treatment process.   She has completed a 10 day course of clindamycin yesterday and I do not seen any active pharyngitis on exam today. She does not have any complains in her throat. However, I would recommend her to be seen by al allergist/immunologist who can probably perform a penicillin skin test for assessing her h/o  childhood penicillin allergy. If this is ruled out, she may be able to use penicillin for future episodes of pharyngitis ( if compatible with a bacterial etiology) which is actually the first line treatment.  Will also do following for r/o other potential causes of pharyngitis   Orders Placed This Encounter  Procedures  . Culture, Group A Strep  . Respiratory virus panel  . Epstein-Barr virus nuclear antigen antibody, IgG  . Epstein-Barr virus VCA antibody panel  . Monospot  . HIV Antibody (routine testing w rflx)   All questions and concerns were discussed and addressed. Patient verbalized understanding of the plan. ____________________________________________________________________________________________________________________  HPI: 65 Year old female with PMH of sleep apnea, Basal cell carcinoma and  chronic carriage of streptococcus  throat who is referred for evaluation of recurrent streptococcus pharyngitis. I have reviewed medical records sent by her PCP.   Patient says she had a h/o scarlet fever at the age of 79 and was given penicillin when she developed hives. She does not remember the severity of the reaction in terms of SOB, facial and lip swelling but says she has not taken penicllin/amoxicillin or augmentin after that. She says she always had strep throat since then which was treated with a round of antibiotics. She eventually had done a tonsillectomy at the age of 86. But continued to have recurrent episodes.   She described the episodes as having fevers, chills. Achiness and sorethroat. She also has accompanying cough along with runny nose ( pouring of liquid), tearing of eyes. Denies any nausea, vomiting and abdominal pain. Some loss of taste as well but thinks probably related to the medications. Denies any hoarseness of voice.  Has 2-3 episodes of strep pharyngitis every year. Most recently in February, October 2021 and February 2022 for which she was given courses of  antibiotics ? Azithromycin ( feb), clindamycin ( November), Doxy> clindamycin ( February) respectively. She develops abdominal pain with Doxycycline and hence she was given clindamycin for the last 2 episodes. She finished her last dose of clindamycin yesterday and says she feels better now and her sore throat is resolved.   Denies any rashes, joint pain.  Occasional SOB and chest pain  Denies GU symptoms  Appetite is good. Had lost some weight but intentional   Says has a heart murmur but denies h/o acute rheumatic fevers. 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 Basal cell carcinoma   . Headache   . Hypertension   . Mitral valve prolapse   . Sleep apnea    cpap  . SVT (supraventricular tachycardia) (HCC)    Past Surgical History:  Procedure Laterality Date  . BACK SURGERY  1998  . ROOT CANAL    . TONSILLECTOMY  1994   Current Outpatient Medications on File Prior to Visit  Medication Sig Dispense Refill  . busPIRone (BUSPAR) 5 MG tablet TAKE 1 TABLET BY MOUTH AS NEEDED AT BEDTIME 90 tablet 2   No current facility-administered medications on file prior to visit.    Allergies  Allergen Reactions  . Penicillins Hives  . Sulfa Antibiotics Hives   Social History   Socioeconomic History  . Marital status: Single    Spouse name: Not on file  . Number of children: Not on file  . Years of education: Not on file  . Highest education level: Not on file  Occupational History  . Not on file  Tobacco Use  . Smoking status: Never Smoker  . Smokeless tobacco:  Never Used  Substance and Sexual Activity  . Alcohol use: No  . Drug use: No  . Sexual activity: Not on file  Other Topics Concern  . Not on file  Social History Narrative  . Not on file   Social Determinants of Health   Financial Resource Strain: Not  on file  Food Insecurity: Not on file  Transportation Needs: Not on file  Physical Activity: Not on file  Stress: Not on file  Social Connections: Not on file  Intimate Partner Violence: Not on file    Vitals BP (!) 145/89   Pulse 65   Temp 98 F (36.7 C) (Oral)   Ht 5\' 9"  (1.753 m)   Wt 148 lb (67.1 kg)   SpO2 97%   BMI 21.86 kg/m    Examination  General - not in acute distress, comfortably sitting in chair HEENT - PEERLA, no pallor and no icterus, NO PHARYNGEAL ERYTHEMA/EXUDATES , UVULA WNL, H/O TONSILLECTOMY  Chest - b/l clear air entry, no additional sounds CVS- Normal s1s2, RRR, MURMUR +/- Abdomen - Soft, Non tender , non distended Ext- no pedal edema Neuro: grossly normal Back - WNL Psych : calm and cooperative   Recent labs See above  Pertinent Microbiology See above  Pertinent Imaging All pertinent labs/Imagings/notes reviewed. All pertinent plain films and CT images have been personally visualized and interpreted; radiology reports have been reviewed. Decision making incorporated into the Impression / Recommendations.  I spent 60  minutes with the patient including  review of prior medical records with greater than 50% of time in face to face counsel of the patient and coordination of care.  Electronically signed by:  Rosiland Oz, MD Infectious Disease Physician [facility] [address] [place], [place] [address] Phone: [phone]  Fax: [phone]

## 2021-02-20 LAB — EPSTEIN-BARR VIRUS VCA ANTIBODY PANEL
EBV NA IgG: 310 U/mL — ABNORMAL HIGH
EBV VCA IgG: >750 U/mL — ABNORMAL HIGH
EBV VCA IgM: <36 U/mL

## 2021-02-20 LAB — HIV ANTIBODY (ROUTINE TESTING W REFLEX): HIV 1&2 Ab, 4th Generation: NONREACTIVE

## 2021-02-20 LAB — MONONUCLEOSIS SCREEN: Heterophile, Mono Screen: NEGATIVE

## 2021-02-21 LAB — CULTURE, GROUP A STREP
MICRO NUMBER:: 11622766
SPECIMEN QUALITY:: ADEQUATE

## 2021-02-21 LAB — RESPIRATORY VIRUS PANEL

## 2021-02-27 DIAGNOSIS — I10 Essential (primary) hypertension: Secondary | ICD-10-CM | POA: Diagnosis not present

## 2021-02-27 DIAGNOSIS — J0100 Acute maxillary sinusitis, unspecified: Secondary | ICD-10-CM | POA: Diagnosis not present

## 2021-02-27 DIAGNOSIS — Z0000 Encounter for general adult medical examination without abnormal findings: Secondary | ICD-10-CM | POA: Diagnosis not present

## 2021-04-04 DIAGNOSIS — R0789 Other chest pain: Secondary | ICD-10-CM | POA: Diagnosis not present

## 2021-04-04 DIAGNOSIS — I10 Essential (primary) hypertension: Secondary | ICD-10-CM | POA: Diagnosis not present

## 2021-05-01 DIAGNOSIS — L570 Actinic keratosis: Secondary | ICD-10-CM | POA: Diagnosis not present

## 2021-05-24 ENCOUNTER — Ambulatory Visit: Payer: BLUE CROSS/BLUE SHIELD | Admitting: Cardiology

## 2021-06-19 DIAGNOSIS — C44722 Squamous cell carcinoma of skin of right lower limb, including hip: Secondary | ICD-10-CM | POA: Diagnosis not present

## 2021-07-15 ENCOUNTER — Ambulatory Visit (HOSPITAL_BASED_OUTPATIENT_CLINIC_OR_DEPARTMENT_OTHER): Payer: Self-pay | Admitting: Cardiology

## 2021-09-24 ENCOUNTER — Other Ambulatory Visit: Payer: Self-pay

## 2021-09-24 ENCOUNTER — Encounter (HOSPITAL_BASED_OUTPATIENT_CLINIC_OR_DEPARTMENT_OTHER): Payer: Self-pay | Admitting: Cardiovascular Disease

## 2021-09-24 ENCOUNTER — Encounter (HOSPITAL_BASED_OUTPATIENT_CLINIC_OR_DEPARTMENT_OTHER): Payer: Self-pay | Admitting: Cardiology

## 2021-09-24 ENCOUNTER — Ambulatory Visit (INDEPENDENT_AMBULATORY_CARE_PROVIDER_SITE_OTHER): Payer: 59 | Admitting: Cardiology

## 2021-09-24 VITALS — BP 152/88 | HR 62 | Ht 69.0 in | Wt 144.4 lb

## 2021-09-24 DIAGNOSIS — Z7189 Other specified counseling: Secondary | ICD-10-CM

## 2021-09-24 DIAGNOSIS — R079 Chest pain, unspecified: Secondary | ICD-10-CM

## 2021-09-24 DIAGNOSIS — I10 Essential (primary) hypertension: Secondary | ICD-10-CM | POA: Diagnosis not present

## 2021-09-24 DIAGNOSIS — R002 Palpitations: Secondary | ICD-10-CM

## 2021-09-24 MED ORDER — AMLODIPINE BESYLATE 5 MG PO TABS: 5.0000 mg | ORAL_TABLET | Freq: Every day | ORAL | 3 refills | Status: DC

## 2021-09-24 NOTE — Progress Notes (Signed)
Cardiology Office Note:    Date:  09/24/2021   ID:  [employer], DOB 05-Jul-1956, MRN [id number]  PCP:  Prince Solian, MD  Cardiologist:  Buford Dresser, MD  Referring MD: Prince Solian, MD   CC: new patient consultation for chest pain   History of Present Illness:    Patricia Rangel is a 65 y.o. female with a hx of Basal cell carcinoma, hypertension, mitral valve prolapse, sleep apnea on CPAP, and SVT who is seen as a new consult at the request of Avva, Ravisankar, MD for the evaluation and management of atypical chest pain.  Note from 04/04/21 from Dr. Dagmar Hait reviewed.  Chest pain: -Initial onset: A couple years -Quality: Uncomfortable and noticeable, like "a lingering tug", usually located in upper/central chest. -Frequency: Random, a few times a week, not every day. Gradually occurring more frequently lately. -Duration: May be a few minutes -Associated symptoms: Noticeable, minor "tugging" pain -Aggravating/alleviating factors: Unknown. Applying pressure does not seem to relieve pain. -Prior cardiac history: none -Prior workup: monitor 2019 -Prior treatment: none -Tobacco: Never -Comorbidities: Hypertension (medically controlled in the past), Mitral valve prolapse (dx in the 1990's) -Exercise level: Regularly walks up to 3 miles, aims for her heart rate in the 150s. -Cardiac ROS: no shortness of breath, no PND, no orthopnea, no LE edema, no syncope -Family history: Father and Mother have hypertension. Father had dementia, pacemaker, diabetes, possibly mini-strokes.  Today, she continues to have episodes of random chest pain.   Has never slept well, even with her CPAP. Often she wakes up startled with a racing heart rate. At one time she woke up with the racing heart rate as high as 160 bpm lasting for 3 hours.  Has had other episodes of racing heart rates that occur with exertion.  Infected with Covid the first week of August.  She denies any shortness of  breath. No lightheadedness, headaches, syncope, orthopnea, or PND. Also has no lower extremity edema.  Past Medical History:  Diagnosis Date   Basal cell carcinoma    Headache    Hypertension    Mitral valve prolapse    Sleep apnea    cpap   SVT (supraventricular tachycardia) ([facility])     Past Surgical History:  Procedure Laterality Date   BACK SURGERY  1998   ROOT CANAL     TONSILLECTOMY  1994    Current Medications: Current Outpatient Medications on File Prior to Visit  Medication Sig   busPIRone (BUSPAR) 5 MG tablet TAKE 1 TABLET BY MOUTH AS NEEDED AT BEDTIME (Patient not taking: Reported on 02/19/2021)   clindamycin (CLEOCIN) 300 MG capsule Take 300 mg by mouth 3 (three) times daily. (Patient not taking: Reported on 02/19/2021)   No current facility-administered medications on file prior to visit.     Allergies:   Codeine, Doxycycline, Penicillins, and Sulfa antibiotics   Social History   Tobacco Use   Smoking status: Never   Smokeless tobacco: Never  Substance Use Topics   Alcohol use: No   Drug use: No    Family History: family history includes Diabetes in her father; Hypercholesterolemia in her mother; Hypertension in her father and mother; Thyroid disease in her mother. There is no history of Colon cancer.  ROS:   Please see the history of present illness.  Additional pertinent ROS: Constitutional: Negative for chills, fever, night sweats, unintentional weight loss  HENT: Negative for ear pain and hearing loss.   Eyes: Negative for loss of vision and eye  pain.  Respiratory: Negative for cough, sputum, wheezing.   Cardiovascular: See HPI. Gastrointestinal: Negative for abdominal pain, melena, and hematochezia.  Genitourinary: Negative for dysuria and hematuria.  Musculoskeletal: Negative for falls and myalgias.  Skin: Negative for itching and rash.  Neurological: Negative for focal weakness, focal sensory changes and loss of consciousness.  Endo/Heme/Allergies:  Does not bruise/bleed easily.     EKGs/Labs/Other Studies Reviewed:    The following studies were reviewed today:  Monitor 04/06/2018: NSR Normal  EKG:  EKG is personally reviewed.   09/24/2021: 62 bpm with nonspecific ST pattern  Recent Labs: No results found for requested labs within last 8760 hours.   Recent Lipid Panel No results found for: CHOL, TRIG, HDL, CHOLHDL, VLDL, LDLCALC, LDLDIRECT  Physical Exam:    VS:  BP (!) 152/88 (BP Location: Left Arm, Patient Position: Sitting, Cuff Size: Normal)   Pulse 62   Ht 5\' 9"  (1.753 m)   Wt 144 lb 6.4 oz (65.5 kg)   SpO2 98%   BMI 21.32 kg/m     Wt Readings from Last 3 Encounters:  09/24/21 144 lb 6.4 oz (65.5 kg)  02/19/21 148 lb (67.1 kg)  12/13/19 163 lb (73.9 kg)    GEN: Well nourished, well developed in no acute distress HEENT: Normal, moist mucous membranes NECK: No JVD CARDIAC: regular rhythm, normal S1 and S2, no rubs or gallops. No murmur. VASCULAR: Radial and DP pulses 2+ bilaterally. No carotid bruits RESPIRATORY:  Clear to auscultation without rales, wheezing or rhonchi  ABDOMEN: Soft, non-tender, non-distended MUSCULOSKELETAL:  Ambulates independently SKIN: Warm and dry, no edema NEUROLOGIC:  Alert and oriented x 3. No focal neuro deficits noted. PSYCHIATRIC:  Normal affect    ASSESSMENT:    1. Essential hypertension   2. Chest pain of uncertain etiology   3. Heart palpitations   4. Cardiac risk counseling   5. Counseling on health promotion and disease prevention    PLAN:    Chest pain Palpitations -We spent significant time today reviewing different parts of the cardiovascular system (electrical, vascular, functional, and valvular). We discussed how each of these systems can present with different symptoms. We reviewed that there are different ways we evaluate these symptoms with tests. We reviewed which tests I think are most appropriate given the symptoms, and we discussed risks/benefits and  limitations of each of these tests. Please see summary below. We also discussed that if testing is unrevealing for a cardiac cause of the symptoms, there are many noncardiac causes as well that can contribute to symptoms. If the heart is ruled out, then I recommend returning to PCP to discuss alternative diagnoses.  -we discussed options for further evaluation, including stress testing, coronary CT, and nuclear stress. Also discussed a monitor. After shared decision making, decided not to purse testing at this time -did discuss Kardia[employer] for racing heart beats -reviewed red flag warning signs that need immediate medical attention  Hypertension: -elevated BP today -starting amlodipine 5 mg daily -instructed on how to take BP at home -she has routine follow up with Dr. Dagmar Hait but will call me with any questions or concerns  Cardiac risk counseling and prevention recommendations: -recommend heart healthy/Mediterranean diet, with whole grains, fruits, vegetable, fish, lean meats, nuts, and olive oil. Limit salt. -recommend moderate walking, 3-5 times/week for 30-50 minutes each session. Aim for at least 150 minutes.week. Goal should be pace of 3 miles/hours, or walking 1.5 miles in 30 minutes -recommend avoidance of tobacco products. Avoid excess alcohol. -  ASCVD risk score: The ASCVD Risk score (Arnett DK, et al., 2019) failed to calculate for the following reasons:   Cannot find a previous HDL lab   Cannot find a previous total cholesterol lab    Plan for follow up: I would be happy to see her back as needed, especially if her symptoms worsen.  Buford Dresser, MD, PhD, [facility]    Medication Adjustments/Labs and Tests Ordered: Current medicines are reviewed at length with the patient today.  Concerns regarding medicines are outlined above.   Orders Placed This Encounter  Procedures   EKG 12-Lead     Meds ordered this encounter  Medications    amLODipine (NORVASC) 5 MG tablet    Sig: Take 1 tablet (5 mg total) by mouth daily.    Dispense:  90 tablet    Refill:  3     Patient Instructions  how to check blood pressure:  -sit comfortably in a chair, feet uncrossed and flat on floor, for 5-10 minutes  -arm ideally should rest at the level of the heart. However, arm should be relaxed and not tense (for example, do not hold the arm up unsupported)  -avoid exercise, caffeine, and tobacco for at least 30 minutes prior to BP reading  -don't take BP cuff reading over clothes (always place on skin directly)  -I prefer to know how well the medication is working, so I would like you to take your readings 1-2 hours after taking your blood pressure medication if possible   We are going to start amlodipine for blood pressure. You can take any time of day. It may take some time to see a difference, but goal is <130/80  Consider a KardiaMobile for the fast heart beats    I,Mathew Stumpf,acting as a scribe for [employer], MD.,have documented all relevant documentation on the behalf of Buford Dresser, MD,as directed by  Buford Dresser, MD while in the presence of Buford Dresser, MD.  I, Buford Dresser, MD, have reviewed all documentation for this visit. The documentation on 10/27/21 for the exam, diagnosis, procedures, and orders are all accurate and complete.   Signed, Buford Dresser, MD PhD 09/24/2021 10:50 AM    [place]

## 2021-09-24 NOTE — Patient Instructions (Addendum)
Medication Instructions:  START: Amlodipine 5 mg daily  *If you need a refill on your cardiac medications before your next appointment, please call your pharmacy*   Lab Work: None ordered today   Testing/Procedures: Your physician has requested that you have an exercise tolerance test. For further information please visit [email or web address]. Please also follow instruction sheet, as given. [place]   Follow-Up: At [facility], you and your health needs are our priority.  As part of our continuing mission to provide you with exceptional heart care, we have created designated Provider Care Teams.  These Care Teams include your primary Cardiologist (physician) and Advanced Practice Providers (APPs -  Physician Assistants and Nurse Practitioners) who all work together to provide you with the care you need, when you need it.  We recommend signing up for the patient portal called "MyChart".  Sign up information is provided on this After Visit Summary.  MyChart is used to connect with patients for Virtual Visits (Telemedicine).  Patients are able to view lab/test results, encounter notes, upcoming appointments, etc.  Non-urgent messages can be sent to your provider as well.   To learn more about what you can do with MyChart, go to [email or web address].    Your next appointment:   As needed  The format for your next appointment:   In Person  Provider:   Buford Dresser, MD   Other Instructions how to check blood pressure:  -sit comfortably in a chair, feet uncrossed and flat on floor, for 5-10 minutes  -arm ideally should rest at the level of the heart. However, arm should be relaxed and not tense (for example, do not hold the arm up unsupported)  -avoid exercise, caffeine, and tobacco for at least 30 minutes prior to BP reading  -don't take BP cuff reading over clothes (always place on skin directly)  -I prefer to know how well the medication is working,  so I would like you to take your readings 1-2 hours after taking your blood pressure medication if possible   We are going to start amlodipine for blood pressure. You can take any time of day. It may take some time to see a difference, but goal is <130/80   Hydrographic surveyor: Website: [email or web address]/  DR. Harrell Gave RECOMMENDS YOU PURCHASE  " Kardia" [employer] FROM THE  G[employer]  APP PLAY STORE.  THE APP IS FREE , BUT THE  EQUIPMENT HAS A COST. IT ALLOWS YOU TO OBTAIN A RECORDING OF YOUR HEART RATE AND RHYTHM BY PROVIDING A SHORT STRIP THAT YOU CAN SHARE WITH YOUR PROVIDER.       [facility] at [facility] [address] [place], [place] [address] Phone:  [phone]        You are scheduled for an Exercise Stress Test  Please arrive 15 minutes prior to your appointment time for registration and insurance purposes.  The test will take approximately 45 minutes to complete.  How to prepare for your Exercise Stress Test: Do bring a list of your current medications with you.  If not listed below, you may take your medications as normal. Do wear comfortable clothes (no dresses or overalls) and walking shoes, tennis shoes preferred (no heels or open toed shoes are allowed) Do Not wear cologne, perfume, aftershave or lotions (deodorant is allowed). Please report to [place], [address] for your test.  If these instructions are not followed, your test will have to be rescheduled.  If you have questions or concerns about your appointment, you can call the Stress Lab at [phone].  If you cannot keep your appointment, please provide 24 hours notification to the Stress Lab, to avoid a possible $50 charge to your account

## 2021-09-26 ENCOUNTER — Encounter (HOSPITAL_BASED_OUTPATIENT_CLINIC_OR_DEPARTMENT_OTHER): Payer: Self-pay

## 2021-10-01 ENCOUNTER — Encounter (HOSPITAL_BASED_OUTPATIENT_CLINIC_OR_DEPARTMENT_OTHER): Payer: Self-pay

## 2021-10-17 ENCOUNTER — Telehealth (HOSPITAL_COMMUNITY): Payer: Self-pay | Admitting: Cardiology

## 2021-10-17 NOTE — Telephone Encounter (Signed)
Patient called and cancelled GXT for the reason below: 10/17/2021 9:37 AM AP:OLIDC, Patricia Rangel  Cancel Rsn: Patient (states she has the flu/will call back to reschedule) Order will be removed from the GXT WQ and when patient calls back to reschedule we will reinstate the order. Thank you

## 2021-10-18 ENCOUNTER — Ambulatory Visit (HOSPITAL_COMMUNITY): Admission: RE | Admit: 2021-10-18 | Payer: 59 | Source: Ambulatory Visit | Attending: Cardiology | Admitting: Cardiology

## 2022-03-27 ENCOUNTER — Ambulatory Visit: Payer: 59 | Admitting: Neurology

## 2022-03-27 ENCOUNTER — Encounter: Payer: Self-pay | Admitting: Neurology

## 2022-03-27 ENCOUNTER — Ambulatory Visit: Payer: BLUE CROSS/BLUE SHIELD | Admitting: Neurology

## 2022-03-27 VITALS — BP 133/76 | HR 61 | Ht 69.0 in | Wt 158.0 lb

## 2022-03-27 DIAGNOSIS — G44019 Episodic cluster headache, not intractable: Secondary | ICD-10-CM | POA: Diagnosis not present

## 2022-03-27 DIAGNOSIS — G4733 Obstructive sleep apnea (adult) (pediatric): Secondary | ICD-10-CM | POA: Diagnosis not present

## 2022-03-27 DIAGNOSIS — F5104 Psychophysiologic insomnia: Secondary | ICD-10-CM | POA: Diagnosis not present

## 2022-03-27 NOTE — Patient Instructions (Signed)
Screening for Sleep Apnea Sleep apnea is a condition in which breathing pauses or becomes shallow during sleep. Sleep apnea screening is a test to determine if you are at risk for sleep apnea. The test includes a series of questions. It will only takes a few minutes. Your health care provider may ask you to have this test in preparation for surgery or as part of a physical exam. What are the symptoms of sleep apnea? Common symptoms of sleep apnea include: Snoring. Waking up often at night. Daytime sleepiness. Pauses in breathing. Choking or gasping during sleep. Irritability. Forgetfulness. Trouble thinking clearly. Depression. Personality changes. Most people with sleep apnea do not know that they have it. What are the advantages of sleep apnea screening? Getting screened for sleep apnea can help: Ensure your safety. It is important for your health care providers to know whether or not you have sleep apnea, especially if you are having surgery or have other long-term (chronic) health conditions. Improve your health and allow you to get a better night's rest. Restful sleep can help you: Have more energy. Lose weight. Improve high blood pressure. Improve diabetes management. Prevent stroke. Prevent car accidents. What happens during the screening? Screening usually includes being asked a list of questions about your sleep quality. Some questions you may be asked include: Do you snore? Is your sleep restless? Do you have daytime sleepiness? Has a partner or spouse told you that you stop breathing during sleep? Have you had trouble concentrating or memory loss? What is your age? What is your neck circumference? To measure your neck, keep your back straight and gently wrap the tape measure around your neck. Put the tape measure at the middle of your neck, between your chin and collarbone. What is your sex assigned at birth? Do you have or are you being treated for high blood  pressure? If your screening test is positive, you are at risk for the condition. Further testing may be needed to confirm a diagnosis of sleep apnea. Where to find more information You can find screening tools online or at your health care clinic. For more information about sleep apnea screening and healthy sleep, visit these websites: Centers for Disease Control and Prevention: www.cdc.gov American Sleep Apnea Association: www.sleepapnea.org Contact a health care provider if: You think that you may have sleep apnea. Summary Sleep apnea screening can help determine if you are at risk for sleep apnea. It is important for your health care providers to know whether or not you have sleep apnea, especially if you are having surgery or have other chronic health conditions. You may be asked to take a screening test for sleep apnea in preparation for surgery or as part of a physical exam. This information is not intended to replace advice given to you by your health care provider. Make sure you discuss any questions you have with your health care provider. Document Revised: 11/09/2020 Document Reviewed: 11/09/2020 Elsevier Patient Education  2022 Elsevier Inc.  

## 2022-03-27 NOTE — Progress Notes (Signed)
?SLEEP MEDICINE CLINIC ? ? ?Provider:  Larey Seat, M D  ? ?Referring Provider: Prince Solian, MD ?Primary Care Physician:  Prince Solian, MD ? ?Chief Complaint  ?Patient presents with  ? Follow-up  ? Obstructive Sleep Apnea  ?  Rm 10, alone. Pt last seen 12/13/2019. Pt has done well on CPAP. Pt has been using CPAP but states she dislikes using it. Has been having issues getting supplies from DME [facility]. Pt continues to have problems sleeping.   ? ? ?RV for Patricia Rangel: 03-27-2022; ? ?She reports being no longer happy with CPAP. 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 Her husband has had the same infection.  ?She reports not being able to get supplies now that the DME had changed to adapt.  ?She is due for anew machine, and yet unsure if she wants to continue treatment.  ?We discussed  her insomnia too. She is willing to undergo meditation treatment.  ?She reports vivid dreams, "crazy real ", and she has no sleep attacks. 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  ? ? ? ? ? ? ?RV 12-13-2019: ? Patricia Rangel is a 66 y.o. female patient seen here for insomnia- and CPAP problems. ?Refinance has for years woken up at 3 AM reflecting the demands of her work schedule.  She stopped taking sleep aids but also stopped her CPAP but continued to struggle somewhat with sleep.  She feels that she only gets 1 to 2 hours of sleep at a time.  She developed  headaches, cluster headaches that second her because nausea.  CPAP did not change the HA pattern.  ?On the days that she wakes up with a headache it usually resolves by 5 PM.  She is restarted CPAP only recently and it does help her with some sleep but it still does not feel that her sleep is sufficient and it remains fragmented.  A download from her CPAP machine is available starting on 16 December and allowing therefore only an 11-day review or 12-day review each of these 12 days the patient  uses CPAP 7 hours and 42 minutes on average, her set pressure of 7 cmH2O was forced with 3 cm EPR and her residual apneas are interestingly 50% central and 50% obstructive, she does not have major air leaks.  The central apnea index is 2.7/h on the obstructive 2.3/h. ?FSS 27, How likely are you to doze in the following situations: ?0 = not likely, 1 = slight chance, 2 = moderate chance, 3 = high chance ? ?Sitting and Reading? ?Watching Television? ?Sitting inactive in a public place (theater or meeting)? ?Lying down in the afternoon when circumstances permit? 1 ?Sitting and talking to someone? ?Sitting quietly after lunch without alcohol? ?In a car, while stopped for a few minutes in traffic? ?As a passenger in a car for an hour without a break? ? ?Total = 1/ 24  ? ? ?Revisit on 09-09-2018. She was originally referred by Dr. Dagmar Hait for a re-evaluation of sleep apnea, but now presents with insomnia and a possibly non working CPAP. ? ?When I last met with [name] she underwent an AutoSet re-titration and she is now using CPAP compliantly at 87% with an average use at time of 5 hours and 47 minutes, CPAP is set at 7 cmH2O with 3 cm expiratory pressure relief.  She has a residual apnea index of 4.4  of which 2.5 of central and 1.8 obstructive however his total AHI is satisfying. ?CPAP used compliantly since July 2018 improved her sleep overall but then she had developed more and more problems with insomnia.  There are some stressors.  ?There is a feeling of being wired- and thoughts intruding- unable to relax. OCD- ?   ? ?On August 25, 2018-  [name] saw her primary care physician, Dr Dagmar Hait,  and she again stated that she had trouble sleeping.  She has tried duloxetine-Cymbalta she was on Lexapro she had gotten a little bit of helped by alprazolam but the addictive potential meds is not a good choice.  ?  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  ?Her father passed away in July 3112 at 82 years of age , after a very prolonged illness, and also he did live in a Nursing home it was far away from her mother's residence and her own. ? ?I have mentioned that the medications used to treat insomnia are all Band-Aids, they did not cure her insomnia.  The underlying cause can be a momentary stressor or it can be a condition that has been there pretty much all her life.  So ruminating thoughts and intruding thoughts not allowing her to go to sleep are probably part of the insomnia problem.  I would like for her to implement also some extra sleep hygiene. ? I would like for the patient to prepare for sleep at a certain time and give it at least 30 minutes of prep time before she goes to bed this should be her down time.  She should have a hot bath or shower-  should try to stay in bed no longer than 8 hours and no less than 7 hours.  All technology should be turned off 30 minutes before she goes to bed she should dim the lights and if she wants to reach she needs to reading a book was pages not on the ureter.  I recommend nonvocal music or a meditation tape in the background, sometimes a box fan is enough.  She should turn all clocks  AROUND looking at the clock is not helping sleep. 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 ?I have also had success with referring patients for behavior modification therapy especially those that have anxiety, depression or compulsions that keep him from sleeping. The above-named sleep habit changes should be kept in place for 2 weeks no naps in daytime,  getting up at the same time every day- weekday and weekend, going to bed every day at the same time -weekend and weekday. Usually 14 days are enough to consolidate sleep. Melatonin, tryptophane , Valerian-  All are allowed as over-the-counter sleep aids, she may continue Lexapro and Trazodone if she chooses. ?  ? ?Interval history from 06/18/2017. I have the  pleasure of seeing [name] today, who underwent a home sleep test on 09/02/2016, which diagnosed her with an AHI of 17.4, RDI of 21.1 but in significant oxygen desaturations. Only 6 minutes of total sleep time were spent at or below 88% oxygen saturation. The main concern was that this patient had irregular heartbeats with the fastest being 205 bpm and the slowest 48 bpm. For this reason she was not considered a candidate for dental device but had to return for CPAP titration. CPAP titration took place on 10/14/2016 and  reduced her AHI to 0.2 at a pressure of only 7 cm water pressure she also used a nasal pillow in medium size 8 by [employer] fit P 10 . ?I'm able to review a CPAP download with 100% compliance and an average user time of 6 hours and 3 minutes, the patient uses CPAP at 7 cm water pressure with a 3 cm expiratory pressure relief function. Residual AHI is 2.7 which is ideal, she does have minimal air leaks there is no evidence of patient's change any setting . ?Mrs. Wertheim still experiences occasional jolting arousals, she will be awake for up to 20 minutes trying to go back to sleep. These arousals a scary. She does not experience shortness of breath and her headaches have improved since she uses CPAP she certainly is no longer snoring. The question remains if she still has tachycardia and bradycardia spells but could culminate in such a jolt. She sleeps still in intervals of 90-120 minutes. Anxiety?  ? ?Chief complaint according to patient : "I cannot longer sleep in 2 hours en bloc", "  I wake up with a severe startle, and this will evolve into a severe headache" ?I have seen Mrs. Schlotzhauer almost 4 years ago for a sleep evaluation she had a history of obstructive sleep apnea with prior CPAP use through the year 2009 but was unable to continue using CPAP. She presented at that  time with sleep complains of excessive daytime sleepiness waking with a dry mouth very fragmented sleep with morning headache  and nocturia. I had ordered a split-night polysomnography which was performed on 02/16/2013 and showed in the baseline diagnostic part an AHI of 24.3 per hour of sleep and an RDI of 28.2. During REM slee

## 2022-04-03 ENCOUNTER — Telehealth: Payer: Self-pay

## 2022-04-03 NOTE — Telephone Encounter (Signed)
LVM for pt to call me back to schedule sleep study  

## 2022-04-14 ENCOUNTER — Telehealth: Payer: Self-pay

## 2022-04-14 NOTE — Telephone Encounter (Signed)
LVM for pt to call me back to schedule sleep study  

## 2022-04-24 ENCOUNTER — Telehealth: Payer: Self-pay

## 2022-04-24 NOTE — Telephone Encounter (Signed)
We have attempted to call the patient 2 times to schedule sleep study. Patient has been unavailable at the phone numbers we have on file and has not returned our calls. If patient calls back we will schedule them for their sleep study. ° °

## 2022-06-24 ENCOUNTER — Emergency Department (HOSPITAL_BASED_OUTPATIENT_CLINIC_OR_DEPARTMENT_OTHER): Payer: 59 | Admitting: Radiology

## 2022-06-24 ENCOUNTER — Encounter (HOSPITAL_BASED_OUTPATIENT_CLINIC_OR_DEPARTMENT_OTHER): Payer: Self-pay | Admitting: Obstetrics and Gynecology

## 2022-06-24 ENCOUNTER — Other Ambulatory Visit: Payer: Self-pay

## 2022-06-24 DIAGNOSIS — R0789 Other chest pain: Secondary | ICD-10-CM | POA: Insufficient documentation

## 2022-06-24 DIAGNOSIS — I10 Essential (primary) hypertension: Secondary | ICD-10-CM | POA: Insufficient documentation

## 2022-06-24 DIAGNOSIS — R002 Palpitations: Secondary | ICD-10-CM | POA: Diagnosis present

## 2022-06-24 LAB — BASIC METABOLIC PANEL
Anion gap: 9 (ref 5–15)
BUN: 9 mg/dL (ref 8–23)
CO2: 27 mmol/L (ref 22–32)
Calcium: 10.3 mg/dL (ref 8.9–10.3)
Chloride: 108 mmol/L (ref 98–111)
Creatinine, Ser: 0.58 mg/dL (ref 0.44–1.00)
GFR, Estimated: >60 mL/min (ref >60)
Glucose, Bld: 109 mg/dL — ABNORMAL HIGH (ref 70–99)
Potassium: 3.6 mmol/L (ref 3.5–5.1)
Sodium: 144 mmol/L (ref 135–145)

## 2022-06-24 LAB — CBC
HCT: 41.5 % (ref 36.0–46.0)
Hemoglobin: 14 g/dL (ref 12.0–15.0)
MCH: 30.7 pg (ref 26.0–34.0)
MCHC: 33.7 g/dL (ref 30.0–36.0)
MCV: 91 fL (ref 80.0–100.0)
Platelets: 216 10*3/uL (ref 150–400)
RBC: 4.56 MIL/uL (ref 3.87–5.11)
RDW: 12.8 % (ref 11.5–15.5)
WBC: 6 10*3/uL (ref 4.0–10.5)
nRBC: 0 % (ref 0.0–0.2)

## 2022-06-24 NOTE — ED Triage Notes (Signed)
Patient reports to the ER for HTN. Patient reports that she just doesn't feel right. Patient states her BP was 180's/?. Patient reports she took an aspirin and has felt some shortness of breath with just sitting. Patient reports she was unsure if she should just ignore the sensations but then decided to come to the ER.

## 2022-06-25 ENCOUNTER — Emergency Department (HOSPITAL_BASED_OUTPATIENT_CLINIC_OR_DEPARTMENT_OTHER)
Admission: EM | Admit: 2022-06-25 | Discharge: 2022-06-25 | Disposition: A | Discharge status: Home / Self Care | Payer: 59 | Attending: Emergency Medicine | Admitting: Emergency Medicine

## 2022-06-25 DIAGNOSIS — R002 Palpitations: Secondary | ICD-10-CM

## 2022-06-25 DIAGNOSIS — R0789 Other chest pain: Secondary | ICD-10-CM

## 2022-06-25 LAB — D-DIMER, QUANTITATIVE: D-Dimer, Quant: 0.42 ug/mL-FEU (ref 0.00–0.50)

## 2022-06-25 LAB — TROPONIN I (HIGH SENSITIVITY)
Troponin I (High Sensitivity): 13 ng/L (ref <18)
Troponin I (High Sensitivity): 8 ng/L (ref <18)

## 2022-06-25 MED ORDER — ASPIRIN 81 MG PO CHEW
243.0000 mg | CHEWABLE_TABLET | Freq: Once | ORAL | Status: AC
  Administered 2022-06-25: 243 mg via ORAL
  Filled 2022-06-25: qty 3

## 2022-06-25 NOTE — ED Provider Notes (Signed)
[facility] EMERGENCY DEPT Provider Note   CSN: [id number] Arrival date & time: 06/24/22  2206     History  Chief Complaint  Patient presents with   Hypertension    Patricia Rangel is a 66 y.o. female.  The history is provided by the patient.  Hypertension  \She has history of hypertension and comes in because of chest discomfort and rapid heart rate as well as elevated blood pressure.  She states that she has put on about 25 pounds in the last 6 months, and decided she wanted to start walking to try to lose weight.  She will walked twice last week and once today at a reasonable walking pace.  While walking, her heart rate up as high as 160, per her watch app.  She is feeling weak at the end of the walk even though she is walking less than 3 miles.  She also is feeling out of breath.  She had a tight feeling in her chest while walking today.  After the walk, she was sitting down resting, but her watch said her heart rate was still over 130.  She did check her blood pressure and was 833 systolic.  She did take a dose of aspirin 81 mg.  She is a non-smoker and denies history of diabetes or hyperlipidemia and there is no family history of premature coronary atherosclerosis.   Home Medications Prior to Admission medications   Medication Sig Start Date End Date Taking? Authorizing Provider  amLODipine (NORVASC) 5 MG tablet Take 1 tablet (5 mg total) by mouth daily. 09/24/21 09/19/22  Buford Dresser, MD  MELATONIN PO Take 1 each by mouth at bedtime.    [provider]      Allergies    Codeine, Doxycycline, Penicillins, and Sulfa antibiotics    Review of Systems   Review of Systems  All other systems reviewed and are negative.   Physical Exam Updated Vital Signs BP (!) 143/82 (BP Location: Right Arm)   Pulse 74   Temp 98.7 F (37.1 C)   Resp 14   Ht '5\' 9"'$  (1.753 m)   Wt 72.1 kg   SpO2 97%   BMI 23.48 kg/m  Physical Exam Vitals and nursing note  reviewed.   66 year old female, resting comfortably and in no acute distress. Vital signs are significant for borderline elevated blood pressure. Oxygen saturation is 97%, which is normal. Head is normocephalic and atraumatic. PERRLA, EOMI. Oropharynx is clear. Neck is nontender and supple without adenopathy or JVD. Back is nontender and there is no CVA tenderness. Lungs are clear without rales, wheezes, or rhonchi. Chest is nontender. Heart has regular rate and rhythm without murmur. Abdomen is soft, flat, nontender. Extremities have no cyanosis or edema, full range of motion is present. Skin is warm and dry without rash. Neurologic: Mental status is normal, cranial nerves are intact, moves all extremities equally.  ED Results / Procedures / Treatments   Labs (all labs ordered are listed, but only abnormal results are displayed) Labs Reviewed  BASIC METABOLIC PANEL - Abnormal; Notable for the following components:      Result Value   Glucose, Bld 109 (*)    All other components within normal limits  CBC  D-DIMER, QUANTITATIVE  TROPONIN I (HIGH SENSITIVITY)  TROPONIN I (HIGH SENSITIVITY)    EKG EKG Interpretation  Date/Time:  Tuesday June 24 2022 23:45:33 EDT Ventricular Rate:  76 PR Interval:  142 QRS Duration: 80 QT Interval:  388  QTC Calculation: 436 R Axis:   52 Text Interpretation: Normal sinus rhythm Cannot rule out Inferior infarct , age undetermined Abnormal ECG When compared with ECG of 29-Mar-1999 03:53, No significant change was found Confirmed by Delora Fuel ([id number]) on 06/24/2022 11:50:20 PM  Radiology DG Chest 2 View  Result Date: 06/25/2022 CLINICAL DATA:  Hypertension chest tightness EXAM: CHEST - 2 VIEW COMPARISON:  None Available. FINDINGS: The heart size and mediastinal contours are within normal limits. Both lungs are clear. The visualized skeletal structures are unremarkable. IMPRESSION: No active cardiopulmonary disease. Electronically Signed   By: Donavan Foil M.D.   On: 06/25/2022 00:10    Procedures Procedures  Cardiac monitor shows normal sinus rhythm, per my interpretation.  Medications Ordered in ED Medications  aspirin chewable tablet 243 mg (has no administration in time range)    ED Course/ Medical Decision Making/ A&P                           Medical Decision Making Amount and/or Complexity of Data Reviewed Labs: ordered. Radiology: ordered.  Risk OTC drugs.   Chest discomfort and fatigue with moderate exertion, tachycardia with moderate exertion and persistent tachycardia at rest.  This could be simple deconditioning, and need to consider possible exertional angina.  Also consider tachyarrhythmia such as atrial fibrillation.  While symptoms are not typical, need to consider pulmonary embolism.  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  Chest x-ray shows no acute cardiopulmonary disease.  I have independently viewed the images, and agree with radiologist interpretation.  I have reviewed and interpreted the laboratory tests and my interpretation is normal troponin, borderline elevated glucose, normal CBC.  I have ordered a repeat troponin as well as a D-dimer.  I have ordered additional aspirin.  Patient states that she had seen a cardiologist in the past who had recommended a stress test but it was never done.  Patient will likely need to have a stress test as well as outpatient cardiac monitor.  Repeat troponin is normal and D-dimer is normal.  Patient has had no further tachyarrhythmias and is felt to be safe for discharge.  She is referred back to her cardiologist for further outpatient work-up.  Final Clinical Impression(s) / ED Diagnoses Final diagnoses:  Palpitation  Chest discomfort    Rx / DC Orders ED Discharge Orders     None         Delora Fuel, MD 63/87/56 [phone]

## 2022-06-25 NOTE — ED Notes (Signed)
Lab to result D-dimer from previously collected labs

## 2022-06-25 NOTE — Discharge Instructions (Signed)
Please follow-up with the cardiologist for further outpatient evaluation.  Return to the emergency department if you have any new or concerning symptoms.

## 2022-06-26 ENCOUNTER — Ambulatory Visit (HOSPITAL_BASED_OUTPATIENT_CLINIC_OR_DEPARTMENT_OTHER): Payer: 59

## 2022-06-26 ENCOUNTER — Ambulatory Visit (INDEPENDENT_AMBULATORY_CARE_PROVIDER_SITE_OTHER): Payer: 59 | Admitting: Family

## 2022-06-26 ENCOUNTER — Encounter (HOSPITAL_BASED_OUTPATIENT_CLINIC_OR_DEPARTMENT_OTHER): Payer: Self-pay | Admitting: Family

## 2022-06-26 ENCOUNTER — Ambulatory Visit (INDEPENDENT_AMBULATORY_CARE_PROVIDER_SITE_OTHER): Payer: 59

## 2022-06-26 VITALS — BP 128/88 | HR 66 | Ht 69.0 in | Wt 158.0 lb

## 2022-06-26 DIAGNOSIS — R002 Palpitations: Secondary | ICD-10-CM

## 2022-06-26 DIAGNOSIS — I10 Essential (primary) hypertension: Secondary | ICD-10-CM

## 2022-06-26 DIAGNOSIS — R079 Chest pain, unspecified: Secondary | ICD-10-CM | POA: Diagnosis not present

## 2022-06-26 MED ORDER — METOPROLOL TARTRATE 25 MG PO TABS: 25.0000 mg | ORAL_TABLET | ORAL | 1 refills | Status: DC | PRN

## 2022-06-26 NOTE — Progress Notes (Signed)
Office Visit    Patient Name: Patricia Rangel Date of Encounter: 06/26/2022  PCP:  Prince Solian, [facility]  Cardiologist:  Buford Dresser, MD  Advanced Practice Provider:  No care team member to display Electrophysiologist:  None      Chief Complaint    Patricia Rangel is a 66 y.o. female presents today for follow-up after ED visit  Past Medical History    Past Medical History:  Diagnosis Date   Basal cell carcinoma    Headache    Hypertension    Mitral valve prolapse    Sleep apnea    cpap   SVT (supraventricular tachycardia) ([place])    Past Surgical History:  Procedure Laterality Date   [place]    Allergies  Allergies  Allergen Reactions   Codeine Nausea Only   Doxycycline     Abdominal pain    Penicillins Hives   Sulfa Antibiotics Hives    History of Present Illness    Patricia Rangel is a 66 y.o. female with a hx of hypertension, palpitations, chest pain last seen 09/24/21.  She presented to the emergency room yesterday with chest discomfort and rapid heart rate.  She recently started walking for exercise after gaining 25 pounds over 6 months with heart rate as high as 160 bpm.  She felt fatigued as well as dyspneic and tight in her chest while walking.  EKG with NSR with possible prior inferior change.  Chest x-ray no acute findings.  She presents today for follow up. Notes had COVID in February and does not feel she recovered as completely as she did when had COVID in August. Notes intermittent chest discomfort that dates back a couple years. During most recent episode of palpitations attempted vagal maneuver but did not help. Earlier that day felt a bit "off". Wearing her CPAP regularly.   EKGs/Labs/Other Studies Reviewed:   The following studies were reviewed today:   EKG:  No EKG today.   Recent Labs: 06/24/2022: BUN 9; Creatinine, Ser 0.58; Hemoglobin 14.0;  Platelets 216; Potassium 3.6; Sodium 144  Recent Lipid Panel No results found for: "CHOL", "TRIG", "HDL", "CHOLHDL", "VLDL", "LDLCALC", "LDLDIRECT"   Home Medications   Current Meds  Medication Sig   amLODipine (NORVASC) 5 MG tablet Take 1 tablet (5 mg total) by mouth daily.   MELATONIN PO Take 1 each by mouth at bedtime.     Review of Systems      All other systems reviewed and are otherwise negative except as noted above.  Physical Exam    VS:  BP 128/88   Pulse 66   Ht '5\' 9"'$  (1.753 m)   Wt 158 lb (71.7 kg)   BMI 23.33 kg/m  , BMI Body mass index is 23.33 kg/m.  Wt Readings from Last 3 Encounters:  06/26/22 158 lb (71.7 kg)  06/24/22 159 lb (72.1 kg)  03/27/22 158 lb (71.7 kg)     GEN: Well nourished, well developed, in no acute distress. HEENT: normal. Neck: Supple, no JVD, carotid bruits, or masses. Cardiac: RRR, no murmurs, rubs, or gallops. No clubbing, cyanosis, edema.  Radials/PT 2+ and equal bilaterally.  Respiratory:  Respirations regular and unlabored, clear to auscultation bilaterally. GI: Soft, nontender, nondistended. MS: No deformity or atrophy. Skin: Warm and dry, no rash. Neuro:  Strength and sensation are intact. Psych: Normal affect.  Assessment &  Plan    Chest pain in adult - Mixed typical and atypical features. EKG in ED no acute ST/T wave changes. Due to risk factors including hypertension, plan for cardiac CTA.   Palpitations - Discussed ot stay hydrated, avoid caffeine. 14 day ZIO placed in clinic. ED visit with CBC, BMP unremarkable. Consider TSH at follow up if significant arrhythmias by monitor. Rx PRN Lopressor '25mg'$ .   HTN - BP well controlled. Continue current antihypertensive regimen.  Interested in being more active referred to PREP exercise program at [facility].   Disposition: Follow up  in 4-6 weeks  with Buford Dresser, MD or APP.  Signed, Loel Dubonnet, NP 06/26/2022, 10:01 AM [place]

## 2022-06-26 NOTE — Patient Instructions (Addendum)
Medication Instructions:  Your physician has recommended you make the following change in your medication:    START Metoprolol Tartrate '25mg'$  twice daily as needed for palpitations  *take TWO tablet TWO hours prior to your cardiac CT  START Ibuprofen (Advil) 2 tablets twice daily for 3 days  *If you need a refill on your cardiac medications before your next appointment, please call your pharmacy*  Lab Work: None ordered today.   Testing/Procedures: Your physician has requested that you have cardiac CT. Cardiac computed tomography (CT) is a painless test that uses an x-ray machine to take clear, detailed pictures of your heart.  Please follow instruction sheet as given.  Your physician has recommended that you wear a Zio monitor.   This monitor is a medical device that records the heart's electrical activity. Doctors most often use these monitors to diagnose arrhythmias. Arrhythmias are problems with the speed or rhythm of the heartbeat. The monitor is a small device applied to your chest. You can wear one while you do your normal daily activities. While wearing this monitor if you have any symptoms to push the button and record what you felt. Once you have worn this monitor for the period of time provider prescribed (Usually 14 days), you will return the monitor device in the postage paid box. Once it is returned they will download the data collected and provide [place] with a report which the provider will then review and we will call you with those results. Important tips:  Avoid showering during the first 24 hours of wearing the monitor. Avoid excessive sweating to help maximize wear time. Do not submerge the device, no hot tubs, and no swimming pools. Keep any lotions or oils away from the patch. After 24 hours you may shower with the patch on. Take brief showers with your back facing the shower head.  Do not remove patch once it has been placed because that will interrupt data and decrease  adhesive wear time. Push the button when you have any symptoms and write down what you were feeling. Once you have completed wearing your monitor, remove and place into box which has postage paid and place in your outgoing mailbox.  If for some reason you have misplaced your box then call our office and we can provide another box and/or mail it off for you.  Follow-Up: At [facility], you and your health needs are our priority.  As part of our continuing mission to provide you with exceptional heart care, we have created designated Provider Care Teams.  These Care Teams include your primary Cardiologist (physician) and Advanced Practice Providers (APPs -  Physician Assistants and Nurse Practitioners) who all work together to provide you with the care you need, when you need it.  We recommend signing up for the patient portal called "MyChart".  Sign up information is provided on this After Visit Summary.  MyChart is used to connect with patients for Virtual Visits (Telemedicine).  Patients are able to view lab/test results, encounter notes, upcoming appointments, etc.  Non-urgent messages can be sent to your provider as well.   To learn more about what you can do with MyChart, go to [email or web address].    Your next appointment:   4-6 week(s)  The format for your next appointment:   In Person  Provider:   Buford Dresser, MD or Laurann Montana, NP  Other Instructions    Your cardiac CT will be scheduled at one of the below locations:   [facility]  [place], [place] [address] [phone]  If scheduled at [facility], please arrive at the [facility] [place] [facility] and Children's Entrance (Entrance C2) of [facility] 30 minutes prior to test start time. You can use the FREE valet parking offered at entrance C (encouraged to control the heart rate for the test)  Proceed to the [facility] Radiology Department (first floor) to check-in and test  prep.  All radiology patients and guests should use entrance C2 at [facility], accessed from [facility], even though the hospital's physical address listed is [address].     Please follow these instructions carefully (unless otherwise directed):  On the Night Before the Test: Be sure to Drink plenty of water. Do not consume any caffeinated/decaffeinated beverages or chocolate 12 hours prior to your test. Do not take any antihistamines 12 hours prior to your test.  On the Day of the Test: Drink plenty of water until 1 hour prior to the test. Do not eat any food 4 hours prior to the test. You may take your regular medications prior to the test.  Take metoprolol (Lopressor) two hours prior to test. FEMALES- please wear underwire-free bra if available, avoid dresses & tight clothing      After the Test: Drink plenty of water. After receiving IV contrast, you may experience a mild flushed feeling. This is normal. On occasion, you may experience a mild rash up to 24 hours after the test. This is not dangerous. If this occurs, you can take Benadryl 25 mg and increase your fluid intake. If you experience trouble breathing, this can be serious. If it is severe call 911 IMMEDIATELY. If it is mild, please call our office. If you take any of these medications: Glipizide/Metformin, Avandament, Glucavance, please do not take 48 hours after completing test unless otherwise instructed.  We will call to schedule your test 2-4 weeks out understanding that some insurance companies will need an authorization prior to the service being performed.   For non-scheduling related questions, please contact the cardiac imaging nurse navigator should you have any questions/concerns: Marchia Bond, Cardiac Imaging Nurse Navigator Gordy Clement, Cardiac Imaging Nurse Navigator [facility] Direct Office Dial: [phone]   For scheduling needs,  including cancellations and rescheduling, please call [place], [phone].

## 2022-06-27 ENCOUNTER — Telehealth: Payer: Self-pay

## 2022-06-27 NOTE — Telephone Encounter (Signed)
Call to pt reference PREP referral Aware of referral to PREP and anxious to start class Agreeable to the [facility] location and to schedule of T/TH 230p-345p starting on 08/05/22 Will call her closer to start of class to do intake  Confirmed she has my number for call back

## 2022-07-11 ENCOUNTER — Telehealth (HOSPITAL_COMMUNITY): Payer: Self-pay | Admitting: *Deleted

## 2022-07-11 NOTE — Telephone Encounter (Signed)
Reaching out to patient to offer assistance regarding upcoming cardiac imaging study; pt verbalizes understanding of appt date/time, parking situation and where to check in, pre-test NPO status and medications ordered, and verified current allergies; name and call back number provided for further questions should they arise  Gordy Clement RN Navigator Cardiac Imaging [facility] [phone] office [phone] cell  Patient to take '25mg'$  metoprolol tartrate two hours prior to her cardiac CT scan. She is aware to arrive at 3:30pm.

## 2022-07-14 ENCOUNTER — Encounter (HOSPITAL_BASED_OUTPATIENT_CLINIC_OR_DEPARTMENT_OTHER): Payer: Self-pay

## 2022-07-14 ENCOUNTER — Ambulatory Visit (HOSPITAL_COMMUNITY)
Admission: RE | Admit: 2022-07-14 | Discharge: 2022-07-14 | Disposition: A | Discharge status: Home / Self Care | Payer: 59 | Source: Ambulatory Visit | Attending: Family | Admitting: Family

## 2022-07-14 DIAGNOSIS — R079 Chest pain, unspecified: Secondary | ICD-10-CM | POA: Diagnosis not present

## 2022-07-14 DIAGNOSIS — R002 Palpitations: Secondary | ICD-10-CM | POA: Diagnosis not present

## 2022-07-14 MED ORDER — IOHEXOL 350 MG/ML SOLN
100.0000 mL | Freq: Once | INTRAVENOUS | Status: AC | PRN
  Administered 2022-07-14: 100 mL via INTRAVENOUS

## 2022-07-14 MED ORDER — NITROGLYCERIN 0.4 MG SL SUBL
SUBLINGUAL_TABLET | SUBLINGUAL | Status: AC
  Filled 2022-07-14: qty 2

## 2022-07-14 MED ORDER — NITROGLYCERIN 0.4 MG SL SUBL
0.8000 mg | SUBLINGUAL_TABLET | Freq: Once | SUBLINGUAL | Status: AC
  Administered 2022-07-14: 0.8 mg via SUBLINGUAL

## 2022-07-25 ENCOUNTER — Telehealth: Payer: Self-pay

## 2022-07-25 NOTE — Telephone Encounter (Signed)
LVMT pt requesting call back to schedule intake appt for PREP class starting on 08/05/22

## 2022-07-29 NOTE — Progress Notes (Signed)
[facility] PREP Evaluation  Patient Details  Name: Patricia Rangel MRN: [id number] Date of Birth: [birth year] Age: 66 y.o. PCP: Prince Solian, MD  Vitals:   07/29/22 1330  BP: 122/68  Pulse: 69  SpO2: 95%  Weight: 163 lb 12.8 oz (74.3 kg)     [facility] Eval - 07/29/22 1500       [facility] "PREP" Location   [facility] "PREP" Location [facility]      Referral    Referring Provider Walker    Reason for referral Other;Hypertension    Program Start Date 08/05/22   T/TH 230p-345p x 12 wks     Measurement   Waist Circumference 38 inches    Hip Circumference 40 inches    Body fat 35 percent      Information for [place] stamina wt loss 30 lbs    Current Exercise walking, yard work    Orthopedic Concerns Knee pain(aches), hx of back surgery    Pertinent Medical History undiagnosed heart rate issue, HTN    Current Barriers none    Restrictions/Precautions --   none   Medications that affect exercise Medication causing dizziness/drowsiness      Timed Up and Go (TUGS)   Timed Up and Go Low risk <9 seconds      Mobility and Daily Activities   I find it easy to walk up or down two or more flights of stairs. 2    I have no trouble taking out the trash. 4    I do housework such as vacuuming and dusting on my own without difficulty. 4    I can easily lift a gallon of milk (8lbs). 4    I can easily walk a mile. 4    I have no trouble reaching into high cupboards or reaching down to pick up something from the floor. 2    I do not have trouble doing out-door work such as Armed forces logistics/support/administrative officer, raking leaves, or gardening. 2      Mobility and Daily Activities   I feel younger than my age. 2    I feel independent. 4    I feel energetic. 2    I live an active life.  2    I feel strong. 2    I feel healthy. 2    I feel active as other people my age. 2      How fit and strong are you.   Fit and Strong Total Score 38            Past Medical History:  Diagnosis Date   Basal cell  carcinoma    Headache    Hypertension    Mitral valve prolapse    Sleep apnea    cpap   SVT (supraventricular tachycardia) ([place])    Past Surgical History:  Procedure Laterality Date   [place]   Social History   Tobacco Use  Smoking Status Never   Passive exposure: Past  Smokeless Tobacco Never    Barnett Hatter 07/29/2022, 3:23 PM

## 2022-08-05 ENCOUNTER — Ambulatory Visit (INDEPENDENT_AMBULATORY_CARE_PROVIDER_SITE_OTHER): Payer: 59 | Admitting: Family

## 2022-08-05 ENCOUNTER — Encounter (HOSPITAL_BASED_OUTPATIENT_CLINIC_OR_DEPARTMENT_OTHER): Payer: Self-pay | Admitting: Family

## 2022-08-05 VITALS — BP 118/76 | HR 64 | Ht 69.0 in | Wt 163.0 lb

## 2022-08-05 DIAGNOSIS — G4733 Obstructive sleep apnea (adult) (pediatric): Secondary | ICD-10-CM | POA: Diagnosis not present

## 2022-08-05 DIAGNOSIS — I471 Supraventricular tachycardia, unspecified: Secondary | ICD-10-CM

## 2022-08-05 DIAGNOSIS — I10 Essential (primary) hypertension: Secondary | ICD-10-CM

## 2022-08-05 DIAGNOSIS — R5383 Other fatigue: Secondary | ICD-10-CM | POA: Diagnosis not present

## 2022-08-05 NOTE — Patient Instructions (Signed)
Medication Instructions:  Your Physician recommend you continue on your current medication as directed.    *If you need a refill on your cardiac medications before your next appointment, please call your pharmacy*   Lab Work: Your physician recommends that you return for lab work today- Thyroid Panel, Vitmain D, and B12 Folate   If you have labs (blood work) drawn today and your tests are completely normal, you will receive your results only by: [place] (if you have MyChart) OR A paper copy in the mail If you have any lab test that is abnormal or we need to change your treatment, we will call you to review the results.   Testing/Procedures: None ordered today    Follow-Up: At [facility], you and your health needs are our priority.  As part of our continuing mission to provide you with exceptional heart care, we have created designated Provider Care Teams.  These Care Teams include your primary Cardiologist (physician) and Advanced Practice Providers (APPs -  Physician Assistants and Nurse Practitioners) who all work together to provide you with the care you need, when you need it.  We recommend signing up for the patient portal called "MyChart".  Sign up information is provided on this After Visit Summary.  MyChart is used to connect with patients for Virtual Visits (Telemedicine).  Patients are able to view lab/test results, encounter notes, upcoming appointments, etc.  Non-urgent messages can be sent to your provider as well.   To learn more about what you can do with MyChart, go to [email or web address].    Your next appointment:   6 month(s)  The format for your next appointment:   In Person  Provider:   Buford Dresser, MD or Laurann Montana, NP{  Other Instructions Heart Healthy Diet Recommendations: A low-salt diet is recommended. Meats should be grilled, baked, or boiled. Avoid fried foods. Focus on lean protein sources like fish or chicken with  vegetables and fruits. The American Heart Association is a Microbiologist!  American Heart Association Diet and Lifeystyle Recommendations   Exercise recommendations: The American Heart Association recommends 150 minutes of moderate intensity exercise weekly. Try 30 minutes of moderate intensity exercise 4-5 times per week. This could include walking, jogging, or swimming.   Important Information About Sugar

## 2022-08-05 NOTE — Progress Notes (Signed)
[facility] PREP Weekly Session  Patient Details  Name: [name] MRN: [id number] Date of Birth: 1956-01-31 Age: 66 y.o. PCP: Prince Solian, MD  There were no vitals filed for this visit.   [facility] Weekly seesion - 08/05/22 1700       [facility] "PREP" Location   [facility] "PREP" Location [facility]      Weekly Session   Topic Discussed Goal setting and welcome to the program   scale of perceived exertion   Classes attended to date [place] 08/05/2022, 5:07 PM

## 2022-08-05 NOTE — Progress Notes (Signed)
Office Visit    Patient Name: Patricia Rangel Date of Encounter: 08/05/2022  PCP:  Prince Solian, [facility]  Cardiologist:  Buford Dresser, MD  Advanced Practice Provider:  No care team member to display Electrophysiologist:  None      Chief Complaint    Patricia Rangel is a 66 y.o. female presents today for follow-up after ZIO and cardiac CTA  Past Medical History    Past Medical History:  Diagnosis Date   Basal cell carcinoma    Headache    Hypertension    Mitral valve prolapse    Sleep apnea    cpap   SVT (supraventricular tachycardia) ([place])    Past Surgical History:  Procedure Laterality Date   [place]    Allergies  Allergies  Allergen Reactions   Codeine Nausea Only   Doxycycline     Abdominal pain    Penicillins Hives   Sulfa Antibiotics Hives    History of Present Illness    Patricia Rangel is a 67 y.o. female with a hx of hypertension, palpitations, chest pain last seen 06/26/2022  She presented to the emergency room 06/25/2022 with chest discomfort and rapid heart rate. EKG with NSR with possible prior inferior change.  Chest x-ray no acute findings.  At follow-up 06/26/2022 noted since having COVID 01/2022 felt fatigued.  Also with palpitations.  Cardiac CTA 07/14/2022 coronary calcium score of 0.  14-day monitor 06/2022 with predominantly NSR average heart rate 70 bpm.  11 runs of SVT with fastest 5 beats 169 bpm, longest 20 beats rate 106 bpm.  There was evidence of second-degree AV block-Mobitz 1 which occurred at 5:06 AM.  She presents today for follow-up independently.  We reviewed her cardiac CTA and ZIO monitor.  Reports rare palpitations that are not bothersome.  We will continue her as needed metoprolol.  She does note that she often takes her CPAP offered around 3 AM and has been recommended for repeat sleep study by her neurologist Dr. Brett Fairy and plans to  complete after her insurance changed at the end of the month.  Anticipate this explains her episode of second-degree AV block.  She notes persistent fatigue and just feeling as if she cannot do as much as she used to.  She is starting the prep exercise program at the [facility] today.   EKGs/Labs/Other Studies Reviewed:   The following studies were reviewed today:  Cardiac CTA 06/2022 IMPRESSION: 1. Coronary calcium score of 0. This was 0 percentile for age and sex matched control.   2. Normal coronary origin with right dominance.   3. CAD-RADS 0. No evidence of CAD (0%). Consider non-atherosclerotic causes of chest pain.  EKG:  No EKG today.   Recent Labs: 06/24/2022: BUN 9; Creatinine, Ser 0.58; Hemoglobin 14.0; Platelets 216; Potassium 3.6; Sodium 144  Recent Lipid Panel No results found for: "CHOL", "TRIG", "HDL", "CHOLHDL", "VLDL", "LDLCALC", "LDLDIRECT"   Home Medications   Current Meds  Medication Sig   amLODipine (NORVASC) 5 MG tablet Take 1 tablet (5 mg total) by mouth daily.   MELATONIN PO Take 1 each by mouth at bedtime.   metoprolol tartrate (LOPRESSOR) 25 MG tablet Take 1 tablet (25 mg total) by mouth as needed (palpitations).     Review of Systems      All other systems reviewed and are otherwise negative except as noted  above.  Physical Exam    VS:  BP 118/76   Pulse 64   Ht '5\' 9"'$  (1.753 m)   Wt 163 lb (73.9 kg)   BMI 24.07 kg/m  , BMI Body mass index is 24.07 kg/m.  Wt Readings from Last 3 Encounters:  08/05/22 163 lb (73.9 kg)  07/29/22 163 lb 12.8 oz (74.3 kg)  06/26/22 158 lb (71.7 kg)    GEN: Well nourished, well developed, in no acute distress. HEENT: normal. Neck: Supple, no JVD, carotid bruits, or masses. Cardiac: RRR, no murmurs, rubs, or gallops. No clubbing, cyanosis, edema.  Radials/PT 2+ and equal bilaterally.  Respiratory:  Respirations regular and unlabored, clear to auscultation bilaterally. GI: Soft, nontender, nondistended. MS: No  deformity or atrophy. Skin: Warm and dry, no rash. Neuro:  Strength and sensation are intact. Psych: Normal affect.  Assessment & Plan    Chest pain in adult -no recurrence.  Cardiac CTA coronary calcium score of 0.  No negation for further work-up at this time.  OSA / Second degree Mobitz type 1 - Follows with neurology with plans for updated sleep study after insurance changes next month.  Anticipate second-degree Mobitz type I is due to her often taking her CPAP off at about 3 AM as the episode was at 5 AM.  No lightheadedness, dizziness, near-syncope, syncope.  Fatigue -consider etiology hypothyroidism versus vitamin deficiency versus deconditioning.  She is participating in prep program at [facility].  Update thyroid panel, vitamin B12, vitamin D levels today.  Palpitations /SVT- Discussed ot stay hydrated, avoid caffeine. 14 day ZIO with brief episode of SVT. Continue PRN Lopressor '25mg'$ . Update thyroid panel.   HTN - BP well controlled. Continue current antihypertensive regimen.  Participating in prep exercise program at the [facility].  Disposition: Follow up in 6 month(s) with Buford Dresser, MD or APP.  Signed, Loel Dubonnet, NP 08/05/2022, 9:57 AM [place]

## 2022-08-06 ENCOUNTER — Telehealth: Payer: Self-pay | Admitting: Neurology

## 2022-08-06 NOTE — Telephone Encounter (Signed)
Patient left a voicemail on my phone stated she wanted to schedule her sleep study...  I called her back and had to leave a voicemail asking her what her new insurance is since [employer] will no longer be around started Sept 1.

## 2022-08-13 NOTE — Progress Notes (Signed)
[facility] PREP Weekly Session  Patient Details  Name: Patricia Rangel MRN: [id number] Date of Birth: [birth year] Age: 66 y.o. PCP: Prince Solian, MD  Vitals:   08/12/22 1430  Weight: 163 lb 6.4 oz (74.1 kg)     [facility] Weekly seesion - 08/13/22 1200       [facility] "PREP" Location   [facility] "PREP" Location [facility]      Weekly Session   Topic Discussed Importance of resistance training;Other ways to be active    Minutes exercised this week 225 minutes    Classes attended to date [place] 08/13/2022, 12:14 PM

## 2022-08-14 LAB — VITAMIN D 1,25 DIHYDROXY
Vitamin D 1, 25 (OH)2 Total: 57 pg/mL
Vitamin D2 1, 25 (OH)2: <10 pg/mL
Vitamin D3 1, 25 (OH)2: 57 pg/mL

## 2022-08-14 LAB — B12 AND FOLATE PANEL
Folate: 6.7 ng/mL (ref >3.0)
Vitamin B-12: 413 pg/mL (ref 232–1245)

## 2022-08-14 LAB — THYROID PANEL WITH TSH
Free Thyroxine Index: 2.3 (ref 1.2–4.9)
T3 Uptake Ratio: 27 % (ref 24–39)
T4, Total: 8.7 ug/dL (ref 4.5–12.0)
TSH: 1.64 u[IU]/mL (ref 0.450–4.500)

## 2022-08-15 ENCOUNTER — Telehealth (HOSPITAL_BASED_OUTPATIENT_CLINIC_OR_DEPARTMENT_OTHER): Payer: Self-pay

## 2022-08-15 NOTE — Telephone Encounter (Addendum)
Called results to patient and left results on VM (ok per DPR), instructions left to call office back if patient has any questions!      ----- Message from Loel Dubonnet, NP sent at 08/14/2022  9:19 AM EDT ----- Normal thyroid, vitamin D, vitamin B12. Good result!

## 2022-08-19 NOTE — Progress Notes (Signed)
[facility] PREP Weekly Session  Patient Details  Name: Patricia Rangel MRN: [id number] Date of Birth: March 11, 1956 Age: 66 y.o. PCP: Prince Solian, MD  Vitals:   08/19/22 1652  Weight: 164 lb 9.6 oz (74.7 kg)     [facility] Weekly seesion - 08/19/22 1600       [facility] "PREP" Location   [facility] "PREP" Location [facility]      Weekly Session   Topic Discussed Healthy eating tips    Minutes exercised this week 540 minutes    Classes attended to date [place] 08/19/2022, 4:53 PM

## 2022-08-26 NOTE — Progress Notes (Signed)
[facility] PREP Weekly Session  Patient Details  Name: Patricia Rangel MRN: [id number] Date of Birth: 1956/11/21 Age: 67 y.o. PCP: Prince Solian, MD  Vitals:   08/26/22 1613  Weight: 163 lb 12.8 oz (74.3 kg)     [facility] Weekly seesion - 08/26/22 1600       [facility] "PREP" Location   [facility] "PREP" Location [facility]      Weekly Session   Topic Discussed Health habits    Minutes exercised this week 191 minutes    Classes attended to date [place] 08/26/2022, 4:14 PM

## 2022-09-01 NOTE — Telephone Encounter (Signed)
Left another voicemail for the patient to call me back to see what her up to date insurance is to do the PA for the sleep study.

## 2022-09-03 NOTE — Progress Notes (Signed)
[facility] PREP Weekly Session  Patient Details  Name: Patricia Rangel MRN: [id number] Date of Birth: 1956-03-21 Age: 66 y.o. PCP: Prince Solian, MD  Vitals:   09/02/22 1430  Weight: 164 lb 6.4 oz (74.6 kg)     [facility] Weekly seesion - 09/03/22 1400       [facility] "PREP" Location   [facility] "PREP" Location [facility]      Weekly Session   Topic Discussed Eating for the season    Minutes exercised this week 235 minutes    Classes attended to date 8             [place] 09/03/2022, 2:07 PM

## 2022-09-11 NOTE — Progress Notes (Signed)
[facility] PREP Weekly Session  Patient Details  Name: Patricia Rangel MRN: [id number] Date of Birth: Sep 06, 1956 Age: 66 y.o. PCP: Prince Solian, MD  Vitals:   09/09/22 1430  Weight: 162 lb 6.4 oz (73.7 kg)     [facility] Weekly seesion - 09/11/22 1100       [facility] "PREP" Location   [facility] "PREP" Location [facility]      Weekly Session   Topic Discussed Stress management and problem solving    Minutes exercised this week 220 minutes    Classes attended to date [place] 09/11/2022, 11:29 AM

## 2022-09-17 ENCOUNTER — Other Ambulatory Visit (HOSPITAL_BASED_OUTPATIENT_CLINIC_OR_DEPARTMENT_OTHER): Payer: Self-pay | Admitting: Cardiology

## 2022-09-17 DIAGNOSIS — I10 Essential (primary) hypertension: Secondary | ICD-10-CM

## 2022-09-18 NOTE — Progress Notes (Signed)
[facility] PREP Weekly Session  Patient Details  Name: Patricia Rangel MRN: [id number] Date of Birth: 08/26/1956 Age: 66 y.o. PCP: Prince Solian, MD  Vitals:   09/16/22 1430  Weight: 163 lb 12.8 oz (74.3 kg)     [facility] Weekly seesion - 09/18/22 1300       [facility] "PREP" Location   [facility] "PREP[facility] Location [facility]      Weekly Session   Topic Discussed Expectations and non-scale victories    Minutes exercised this week 200 minutes    Classes attended to date 12             Barnett Hatter 09/18/2022, 1:23 PM

## 2022-09-19 DIAGNOSIS — M25562 Pain in left knee: Secondary | ICD-10-CM | POA: Diagnosis not present

## 2022-09-20 ENCOUNTER — Other Ambulatory Visit (HOSPITAL_BASED_OUTPATIENT_CLINIC_OR_DEPARTMENT_OTHER): Payer: Self-pay | Admitting: Family

## 2022-09-20 DIAGNOSIS — R002 Palpitations: Secondary | ICD-10-CM

## 2022-09-22 NOTE — Telephone Encounter (Signed)
Rx(s) sent to pharmacy electronically.  

## 2022-09-28 NOTE — Progress Notes (Signed)
[facility] PREP Weekly Session  Patient Details  Name: COLEEN CARDIFF MRN: [id number] Date of Birth: 1956/11/20 Age: 66 y.o. PCP: Prince Solian, MD  Vitals:     [facility] Weekly seesion - 09/28/22 1800       [facility] "PREP" Location   [facility]PREP[facility] Location [facility]      Weekly Session   Topic Discussed --   portions   Minutes exercised this week 200 minutes    Classes attended to date [place] 09/28/2022, 6:40 PM

## 2022-09-29 DIAGNOSIS — M25562 Pain in left knee: Secondary | ICD-10-CM | POA: Diagnosis not present

## 2022-09-29 DIAGNOSIS — M170 Bilateral primary osteoarthritis of knee: Secondary | ICD-10-CM | POA: Diagnosis not present

## 2022-10-01 NOTE — Progress Notes (Signed)
[facility] PREP Weekly Session  Patient Details  Name: Patricia Rangel MRN: [id number] Date of Birth: [birth year] Age: 66 y.o. PCP: Prince Solian, MD  Vitals:   09/30/22 1430  Weight: 163 lb (73.9 kg)     [facility] Weekly seesion - 10/01/22 1400       [facility] "PREP" Location   [facility]PREP[facility] Location [facility]      Weekly Session   Topic Discussed Finding support    Minutes exercised this week 165 minutes    Classes attended to date 40             [place] 10/01/2022, 2:57 PM

## 2022-10-09 NOTE — Progress Notes (Signed)
[facility] PREP Weekly Session  Patient Details  Name: Patricia Rangel MRN: [id number] Date of Birth: August 07, 1956 Age: 66 y.o. PCP: Prince Solian, MD  Vitals:   10/07/22 1430  Weight: 161 lb 12.8 oz (73.4 kg)     [facility] Weekly seesion - 10/09/22 1100       [facility] "PREP" Location   [facility] "PREP" [facility]      Weekly Session   Topic Discussed Calorie breakdown    Minutes exercised this week 230 minutes    Classes attended to date 24             [place] 10/09/2022, 11:56 AM

## 2022-10-13 DIAGNOSIS — R059 Cough, unspecified: Secondary | ICD-10-CM | POA: Diagnosis not present

## 2022-10-13 DIAGNOSIS — R0981 Nasal congestion: Secondary | ICD-10-CM | POA: Diagnosis not present

## 2022-10-13 DIAGNOSIS — J069 Acute upper respiratory infection, unspecified: Secondary | ICD-10-CM | POA: Diagnosis not present

## 2022-10-13 DIAGNOSIS — R599 Enlarged lymph nodes, unspecified: Secondary | ICD-10-CM | POA: Diagnosis not present

## 2022-10-13 DIAGNOSIS — R509 Fever, unspecified: Secondary | ICD-10-CM | POA: Diagnosis not present

## 2022-10-13 DIAGNOSIS — Z1152 Encounter for screening for COVID-19: Secondary | ICD-10-CM | POA: Diagnosis not present

## 2022-10-13 DIAGNOSIS — I10 Essential (primary) hypertension: Secondary | ICD-10-CM | POA: Diagnosis not present

## 2022-10-15 NOTE — Progress Notes (Signed)
[facility] PREP Weekly Session  Patient Details  Name: Patricia Rangel MRN: [id number] Date of Birth: 03-16-1956 Age: 66 y.o. PCP: Prince Solian, MD  Vitals:   10/14/22 1430  Weight: 161 lb 3.2 oz (73.1 kg)     [facility] Weekly seesion - 10/15/22 1200       [facility] "PREP" Location   [facility] "PREP[facility] Location [facility]      Weekly Session   Topic Discussed Hitting roadblocks    Classes attended to date 46             [place] 10/15/2022, 12:55 PM

## 2022-10-21 NOTE — Progress Notes (Signed)
[facility] PREP Weekly Session  Patient Details  Name: Patricia Rangel MRN: [id number] Date of Birth: 1956-10-06 Age: 66 y.o. PCP: Prince Solian, MD  Vitals:   10/21/22 1430  Weight: 158 lb 9.6 oz (71.9 kg)     [facility] Weekly seesion - 10/21/22 1600       [facility] "PREP" Location   [facility] "PREP" Location [facility]      Weekly Session   Topic Discussed --   How fit and strong you are survey   Minutes exercised this week 215 minutes    Classes attended to date [place] 10/21/2022, 4:41 PM

## 2022-11-05 NOTE — Progress Notes (Signed)
[facility] PREP Evaluation  Patient Details  Name: Patricia Rangel MRN: [id number] Date of Birth: 03-Mar-1956 Age: 66 y.o. PCP: Prince Solian, MD  Vitals:   11/05/22 1000  BP: 132/85  Pulse: 70  SpO2: 96%  Weight: 162 lb 9.6 oz (73.8 kg)     [facility] Eval - 11/05/22 1200       [facility] "PREP" Location   [facility] "PREP" Location [facility]      Referral    Referring Provider Walker      Measurement   Waist Circumference 37.5 inches    Hip Circumference 38 inches    Body fat 34.2 percent      Information for Trainer   Goals Reset      Mobility and Daily Activities   I find it easy to walk up or down two or more flights of stairs. 3    I have no trouble taking out the trash. 4    I do housework such as vacuuming and dusting on my own without difficulty. 4    I can easily lift a gallon of milk (8lbs). 4    I can easily walk a mile. 4    I have no trouble reaching into high cupboards or reaching down to pick up something from the floor. 4    I do not have trouble doing out-door work such as Armed forces logistics/support/administrative officer, raking leaves, or gardening. 4      Mobility and Daily Activities   I feel younger than my age. 4    I feel independent. 4    I feel energetic. 4    I live an active life.  4    I feel strong. 4    I feel healthy. 4    I feel active as other people my age. 4      How fit and strong are you.   Fit and Strong Total Score 55            Past Medical History:  Diagnosis Date   Basal cell carcinoma    Headache    Hypertension    Mitral valve prolapse    Sleep apnea    cpap   SVT (supraventricular tachycardia) ([place])    Past Surgical History:  Procedure Laterality Date   [place]   Social History   Tobacco Use  Smoking Status Never   Passive exposure: Past  Smokeless Tobacco Never   Attendance : >21 workouts, 12 of 12 educational sessions Marked improvement in How Fit and Strong You Are Survey.  Balance much  improved on single leg Fit test: Cardio: 222 to 259 Sit to stand: 15 to 15 Bicep curl: 18 to 25 Brainstormed on ways to be active and gym options Encouraged to not drop calories below 1500 and to build muscle for weight loss. Encourage better sleep by adding in magnesium and she will send me a food diary to help increase sources of protein.  Barnett Hatter 11/05/2022, 12:06 PM

## 2022-11-11 DIAGNOSIS — M170 Bilateral primary osteoarthritis of knee: Secondary | ICD-10-CM | POA: Diagnosis not present

## 2023-01-05 DIAGNOSIS — W908XXS Exposure to other nonionizing radiation, sequela: Secondary | ICD-10-CM | POA: Diagnosis not present

## 2023-01-05 DIAGNOSIS — Z859 Personal history of malignant neoplasm, unspecified: Secondary | ICD-10-CM | POA: Diagnosis not present

## 2023-01-05 DIAGNOSIS — L570 Actinic keratosis: Secondary | ICD-10-CM | POA: Diagnosis not present

## 2023-01-05 DIAGNOSIS — L738 Other specified follicular disorders: Secondary | ICD-10-CM | POA: Diagnosis not present

## 2023-01-05 DIAGNOSIS — L578 Other skin changes due to chronic exposure to nonionizing radiation: Secondary | ICD-10-CM | POA: Diagnosis not present

## 2023-02-05 ENCOUNTER — Encounter (HOSPITAL_BASED_OUTPATIENT_CLINIC_OR_DEPARTMENT_OTHER): Payer: Self-pay | Admitting: Cardiology

## 2023-02-05 ENCOUNTER — Ambulatory Visit (INDEPENDENT_AMBULATORY_CARE_PROVIDER_SITE_OTHER): Payer: 59 | Admitting: Cardiology

## 2023-02-05 VITALS — BP 120/80 | HR 78 | Ht 69.0 in | Wt 163.0 lb

## 2023-02-05 DIAGNOSIS — I10 Essential (primary) hypertension: Secondary | ICD-10-CM

## 2023-02-05 DIAGNOSIS — M79605 Pain in left leg: Secondary | ICD-10-CM

## 2023-02-05 DIAGNOSIS — Z7189 Other specified counseling: Secondary | ICD-10-CM

## 2023-02-05 NOTE — Progress Notes (Signed)
Cardiology Office Note:    Date:  02/05/2023   ID:  Patricia Rangel, DOB January 06, 1956, MRN [id number]  PCP:  Prince Solian, MD  Cardiologist:  Buford Dresser, MD  Referring MD: Prince Solian, MD   CC: follow up  History of Present Illness:    Patricia Rangel is a 67 y.o. female with a hx of Basal cell carcinoma, hypertension, mitral valve prolapse, sleep apnea on CPAP, and SVT who is seen for follow up today. I initially met her 09/24/21 as a new consult at the request of Avva, Ravisankar, MD for the evaluation and management of atypical chest pain.  Today, the patient states that she has been doing well and her blood pressure has improved. She has had no energy recently, and noted it was hard to get out of the car and walk to the visit.   She had been exercising and injured her knee. She had gone to orthopedics. She also has pain in her legs which causes her to be unable to sleep. She describes the pain as a 8 or 9 out of ten only in her left leg. [place] and noticed some BLE edema (worse in her left leg) after walking long distances the whole day. Her swelling abated the next morning.  From a cardiac perspective she hasn't had any episodes of chest pain or palpitations recently.  She denies any palpitations, chest pain, shortness of breath. lightheadedness, headaches, syncope, orthopnea, or PND.   Past Medical History:  Diagnosis Date   Basal cell carcinoma    Headache    Hypertension    Mitral valve prolapse    Sleep apnea    cpap   SVT (supraventricular tachycardia)     Past Surgical History:  Procedure Laterality Date   BACK SURGERY  1998   ROOT CANAL     TONSILLECTOMY  1994    Current Medications: Current Outpatient Medications on File Prior to Visit  Medication Sig   amLODipine (NORVASC) 5 MG tablet TAKE 1 TABLET (5 MG TOTAL) BY MOUTH DAILY.   MELATONIN PO Take 1 each by mouth at bedtime.   metoprolol tartrate (LOPRESSOR) 25  MG tablet TAKE 1 TABLET (25 MG TOTAL) BY MOUTH AS NEEDED (PALPITATIONS).   No current facility-administered medications on file prior to visit.     Allergies:   Codeine, Doxycycline, Penicillins, and Sulfa antibiotics   Social History   Tobacco Use   Smoking status: Never    Passive exposure: Past   Smokeless tobacco: Never  Vaping Use   Vaping Use: Never used  Substance Use Topics   Alcohol use: No   Drug use: No    Family History: family history includes Diabetes in her father; Hypercholesterolemia in her mother; Hypertension in her father and mother; Thyroid disease in her mother. There is no history of Colon cancer.  ROS:   Please see the history of present illness.   (+) BLE edema (+) Fatigue (+) Knee/Leg pain Additional pertinent ROS otherwise unremarkable.   EKGs/Labs/Other Studies Reviewed:    The following studies were reviewed today:  CT coronary 07/15/22 1. Coronary calcium score of 0. This was 0 percentile for age and sex matched control.   2. Normal coronary origin with right dominance.   3. CAD-RADS 0. No evidence of CAD (0%). Consider non-atherosclerotic causes of chest pain.  Monitor 04/06/2018: NSR Normal  EKG:  EKG is personally reviewed.   02/05/23: not ordered today 09/24/2021: 62 bpm with  nonspecific ST pattern  Recent Labs: 06/24/2022: BUN 9; Creatinine, Ser 0.58; Hemoglobin 14.0; Platelets 216; Potassium 3.6; Sodium 144 08/05/2022: TSH 1.640   Recent Lipid Panel No results found for: "CHOL", "TRIG", "HDL", "CHOLHDL", "VLDL", "LDLCALC", "LDLDIRECT"  Physical Exam:    VS:  BP 120/80 (BP Location: Right Arm, Patient Position: Sitting, Cuff Size: Normal)   Pulse 78   Ht 5' 9"$  (1.753 m)   Wt 163 lb (73.9 kg)   SpO2 96%   BMI 24.07 kg/m     Wt Readings from Last 3 Encounters:  02/05/23 163 lb (73.9 kg)  11/05/22 162 lb 9.6 oz (73.8 kg)  10/21/22 158 lb 9.6 oz (71.9 kg)    GEN: Well nourished, well developed in no acute  distress HEENT: Normal, moist mucous membranes NECK: No JVD CARDIAC: regular rhythm, normal S1 and S2, no rubs or gallops. No murmur. VASCULAR: Radial and DP pulses 2+ bilaterally. No carotid bruits RESPIRATORY:  Clear to auscultation without rales, wheezing or rhonchi  ABDOMEN: Soft, non-tender, non-distended MUSCULOSKELETAL:  Ambulates independently SKIN: Warm and dry, no edema NEUROLOGIC:  Alert and oriented x 3. No focal neuro deficits noted. PSYCHIATRIC:  Normal affect    ASSESSMENT:    1. Essential hypertension   2. Leg pain, diffuse, left   3. Cardiac risk counseling   4. Counseling on health promotion and disease prevention     PLAN:    Chest pain Palpitations -resolved -prior monitor unremarkable -prior coronary CT without any CAD, Calcium score 0 -reviewed red flag warning signs that need immediate medical attention  Hypertension: -at goal -tolerating amlodipine 5 mg daily  Leg pain -left sided, burning, changes with position. Concerning for neurologic source  Cardiac risk counseling and prevention recommendations: -recommend heart healthy/Mediterranean diet, with whole grains, fruits, vegetable, fish, lean meats, nuts, and olive oil. Limit salt. -recommend moderate walking, 3-5 times/week for 30-50 minutes each session. Aim for at least 150 minutes.week. Goal should be pace of 3 miles/hours, or walking 1.5 miles in 30 minutes -recommend avoidance of tobacco products. Avoid excess alcohol. -ASCVD risk score: The ASCVD Risk score (Arnett DK, et al., 2019) failed to calculate for the following reasons:   Cannot find a previous HDL lab   Cannot find a previous total cholesterol lab    Plan for follow up: 1 year.  Buford Dresser, MD, PhD, [facility]    Medication Adjustments/Labs and Tests Ordered: Current medicines are reviewed at length with the patient today.  Concerns regarding medicines are outlined above.   No orders of  the defined types were placed in this encounter.    No orders of the defined types were placed in this encounter.   Patient Instructions  Medication Instructions:  The current medical regimen is effective;  continue present plan and medications.   *If you need a refill on your cardiac medications before your next appointment, please call your pharmacy*   Lab Work: None  Testing/Procedures: None   Follow-Up: At [facility], you and your health needs are our priority.  As part of our continuing mission to provide you with exceptional heart care, we have created designated Provider Care Teams.  These Care Teams include your primary Cardiologist (physician) and Advanced Practice Providers (APPs -  Physician Assistants and Nurse Practitioners) who all work together to provide you with the care you need, when you need it.  We recommend signing up for the patient portal called "MyChart".  Sign up information is  provided on this After Visit Summary.  MyChart is used to connect with patients for Virtual Visits (Telemedicine).  Patients are able to view lab/test results, encounter notes, upcoming appointments, etc.  Non-urgent messages can be sent to your provider as well.   To learn more about what you can do with MyChart, go to [email or web address].    Your next appointment:   1 year(s)  Provider:   Buford Dresser, MD    Other Instructions None    I,Coren O'Brien,acting as a scribe for Buford Dresser, MD.,have documented all relevant documentation on the behalf of Buford Dresser, MD,as directed by  Buford Dresser, MD while in the presence of Buford Dresser, MD.  I, Buford Dresser, MD, have reviewed all documentation for this visit. The documentation on 02/05/23 for the exam, diagnosis, procedures, and orders are all accurate and complete.

## 2023-02-05 NOTE — Patient Instructions (Signed)
Medication Instructions:  The current medical regimen is effective;  continue present plan and medications.   *If you need a refill on your cardiac medications before your next appointment, please call your pharmacy*   Lab Work: None  Testing/Procedures: None   Follow-Up: At [facility], you and your health needs are our priority.  As part of our continuing mission to provide you with exceptional heart care, we have created designated Provider Care Teams.  These Care Teams include your primary Cardiologist (physician) and Advanced Practice Providers (APPs -  Physician Assistants and Nurse Practitioners) who all work together to provide you with the care you need, when you need it.  We recommend signing up for the patient portal called "MyChart".  Sign up information is provided on this After Visit Summary.  MyChart is used to connect with patients for Virtual Visits (Telemedicine).  Patients are able to view lab/test results, encounter notes, upcoming appointments, etc.  Non-urgent messages can be sent to your provider as well.   To learn more about what you can do with MyChart, go to [email or web address].    Your next appointment:   1 year(s)  Provider:   Buford Dresser, MD    Other Instructions None

## 2023-02-11 ENCOUNTER — Other Ambulatory Visit: Payer: Self-pay | Admitting: Internal Medicine

## 2023-02-11 DIAGNOSIS — M48061 Spinal stenosis, lumbar region without neurogenic claudication: Secondary | ICD-10-CM

## 2023-02-19 DIAGNOSIS — Z6824 Body mass index (BMI) 24.0-24.9, adult: Secondary | ICD-10-CM | POA: Diagnosis not present

## 2023-02-19 DIAGNOSIS — Z01419 Encounter for gynecological examination (general) (routine) without abnormal findings: Secondary | ICD-10-CM | POA: Diagnosis not present

## 2023-02-19 DIAGNOSIS — Z1231 Encounter for screening mammogram for malignant neoplasm of breast: Secondary | ICD-10-CM | POA: Diagnosis not present

## 2023-02-19 DIAGNOSIS — Z13820 Encounter for screening for osteoporosis: Secondary | ICD-10-CM | POA: Diagnosis not present

## 2023-03-03 ENCOUNTER — Ambulatory Visit
Admission: RE | Admit: 2023-03-03 | Discharge: 2023-03-03 | Disposition: A | Discharge status: Home / Self Care | Payer: 59 | Source: Ambulatory Visit | Attending: Internal Medicine | Admitting: Internal Medicine

## 2023-03-03 DIAGNOSIS — M545 Low back pain, unspecified: Secondary | ICD-10-CM | POA: Diagnosis not present

## 2023-03-03 DIAGNOSIS — R200 Anesthesia of skin: Secondary | ICD-10-CM | POA: Diagnosis not present

## 2023-03-03 DIAGNOSIS — M48061 Spinal stenosis, lumbar region without neurogenic claudication: Secondary | ICD-10-CM

## 2023-03-23 DIAGNOSIS — D485 Neoplasm of uncertain behavior of skin: Secondary | ICD-10-CM | POA: Diagnosis not present

## 2023-03-23 DIAGNOSIS — L570 Actinic keratosis: Secondary | ICD-10-CM | POA: Diagnosis not present

## 2023-03-25 DIAGNOSIS — R7989 Other specified abnormal findings of blood chemistry: Secondary | ICD-10-CM | POA: Diagnosis not present

## 2023-03-25 DIAGNOSIS — I10 Essential (primary) hypertension: Secondary | ICD-10-CM | POA: Diagnosis not present

## 2023-03-25 DIAGNOSIS — R002 Palpitations: Secondary | ICD-10-CM | POA: Diagnosis not present

## 2023-03-25 DIAGNOSIS — D72819 Decreased white blood cell count, unspecified: Secondary | ICD-10-CM | POA: Diagnosis not present

## 2023-04-01 DIAGNOSIS — Z1339 Encounter for screening examination for other mental health and behavioral disorders: Secondary | ICD-10-CM | POA: Diagnosis not present

## 2023-04-01 DIAGNOSIS — Z1211 Encounter for screening for malignant neoplasm of colon: Secondary | ICD-10-CM | POA: Diagnosis not present

## 2023-04-01 DIAGNOSIS — J309 Allergic rhinitis, unspecified: Secondary | ICD-10-CM | POA: Diagnosis not present

## 2023-04-01 DIAGNOSIS — M48061 Spinal stenosis, lumbar region without neurogenic claudication: Secondary | ICD-10-CM | POA: Diagnosis not present

## 2023-04-01 DIAGNOSIS — Z1331 Encounter for screening for depression: Secondary | ICD-10-CM | POA: Diagnosis not present

## 2023-04-01 DIAGNOSIS — Z0000 Encounter for general adult medical examination without abnormal findings: Secondary | ICD-10-CM | POA: Diagnosis not present

## 2023-04-01 DIAGNOSIS — I10 Essential (primary) hypertension: Secondary | ICD-10-CM | POA: Diagnosis not present

## 2023-04-01 DIAGNOSIS — G4700 Insomnia, unspecified: Secondary | ICD-10-CM | POA: Diagnosis not present

## 2023-04-01 DIAGNOSIS — M79605 Pain in left leg: Secondary | ICD-10-CM | POA: Diagnosis not present

## 2023-04-01 DIAGNOSIS — H6123 Impacted cerumen, bilateral: Secondary | ICD-10-CM | POA: Diagnosis not present

## 2023-04-01 DIAGNOSIS — M419 Scoliosis, unspecified: Secondary | ICD-10-CM | POA: Diagnosis not present

## 2023-04-01 DIAGNOSIS — G4730 Sleep apnea, unspecified: Secondary | ICD-10-CM | POA: Diagnosis not present

## 2023-04-01 DIAGNOSIS — R196 Halitosis: Secondary | ICD-10-CM | POA: Diagnosis not present

## 2023-04-01 DIAGNOSIS — R748 Abnormal levels of other serum enzymes: Secondary | ICD-10-CM | POA: Diagnosis not present

## 2023-04-09 DIAGNOSIS — H9191 Unspecified hearing loss, right ear: Secondary | ICD-10-CM | POA: Diagnosis not present

## 2023-04-09 DIAGNOSIS — H6123 Impacted cerumen, bilateral: Secondary | ICD-10-CM | POA: Diagnosis not present

## 2023-05-01 DIAGNOSIS — I10 Essential (primary) hypertension: Secondary | ICD-10-CM | POA: Diagnosis not present

## 2023-05-19 DIAGNOSIS — M170 Bilateral primary osteoarthritis of knee: Secondary | ICD-10-CM | POA: Diagnosis not present

## 2023-07-08 DIAGNOSIS — H9190 Unspecified hearing loss, unspecified ear: Secondary | ICD-10-CM | POA: Diagnosis not present

## 2023-07-08 DIAGNOSIS — I10 Essential (primary) hypertension: Secondary | ICD-10-CM | POA: Diagnosis not present

## 2023-07-08 DIAGNOSIS — H93A9 Pulsatile tinnitus, unspecified ear: Secondary | ICD-10-CM | POA: Diagnosis not present

## 2023-07-14 DIAGNOSIS — H9313 Tinnitus, bilateral: Secondary | ICD-10-CM | POA: Diagnosis not present

## 2023-07-14 DIAGNOSIS — H903 Sensorineural hearing loss, bilateral: Secondary | ICD-10-CM | POA: Diagnosis not present

## 2023-07-21 ENCOUNTER — Other Ambulatory Visit: Payer: Self-pay | Admitting: Otolaryngology

## 2023-07-21 DIAGNOSIS — H93A9 Pulsatile tinnitus, unspecified ear: Secondary | ICD-10-CM

## 2023-07-29 DIAGNOSIS — C44529 Squamous cell carcinoma of skin of other part of trunk: Secondary | ICD-10-CM | POA: Diagnosis not present

## 2023-07-29 DIAGNOSIS — L820 Inflamed seborrheic keratosis: Secondary | ICD-10-CM | POA: Diagnosis not present

## 2023-08-06 DIAGNOSIS — N819 Female genital prolapse, unspecified: Secondary | ICD-10-CM | POA: Diagnosis not present

## 2023-08-06 DIAGNOSIS — N644 Mastodynia: Secondary | ICD-10-CM | POA: Diagnosis not present

## 2023-08-19 DIAGNOSIS — C44529 Squamous cell carcinoma of skin of other part of trunk: Secondary | ICD-10-CM | POA: Diagnosis not present

## 2023-09-02 DIAGNOSIS — L905 Scar conditions and fibrosis of skin: Secondary | ICD-10-CM | POA: Diagnosis not present

## 2023-09-13 ENCOUNTER — Other Ambulatory Visit: Payer: 59

## 2023-09-27 ENCOUNTER — Ambulatory Visit
Admission: RE | Admit: 2023-09-27 | Discharge: 2023-09-27 | Disposition: A | Discharge status: Home / Self Care | Payer: HMO | Source: Ambulatory Visit | Attending: Otolaryngology | Admitting: Otolaryngology

## 2023-09-27 DIAGNOSIS — H93A9 Pulsatile tinnitus, unspecified ear: Secondary | ICD-10-CM

## 2023-09-27 MED ORDER — GADOPICLENOL 0.5 MMOL/ML IV SOLN
6.0000 mL | Freq: Once | INTRAVENOUS | Status: AC | PRN
  Administered 2023-09-27: 6 mL via INTRAVENOUS

## 2023-09-29 ENCOUNTER — Other Ambulatory Visit (HOSPITAL_BASED_OUTPATIENT_CLINIC_OR_DEPARTMENT_OTHER): Payer: Self-pay | Admitting: Cardiology

## 2023-09-29 DIAGNOSIS — I10 Essential (primary) hypertension: Secondary | ICD-10-CM

## 2023-10-01 ENCOUNTER — Ambulatory Visit: Payer: Self-pay | Admitting: Neurology

## 2023-10-01 ENCOUNTER — Encounter: Payer: Self-pay | Admitting: Neurology

## 2023-10-01 VITALS — BP 114/73 | HR 64 | Ht 69.0 in | Wt 150.0 lb

## 2023-10-01 DIAGNOSIS — R002 Palpitations: Secondary | ICD-10-CM | POA: Diagnosis not present

## 2023-10-01 DIAGNOSIS — F518 Other sleep disorders not due to a substance or known physiological condition: Secondary | ICD-10-CM

## 2023-10-01 DIAGNOSIS — G4733 Obstructive sleep apnea (adult) (pediatric): Secondary | ICD-10-CM

## 2023-10-01 DIAGNOSIS — G44009 Cluster headache syndrome, unspecified, not intractable: Secondary | ICD-10-CM | POA: Diagnosis not present

## 2023-10-01 NOTE — Progress Notes (Signed)
Provider:  Melvyn Novas, MD  Primary Care Physician:  Chilton Greathouse, MD [address] [place] [address]     Referring Provider: Chilton Greathouse, Md [address] [place],  [place] [address]          Chief Complaint according to patient   Patient presents with:     New Patient (Initial Visit)           HISTORY OF PRESENT ILLNESS:  Patricia Rangel is a 67 y.o. female patient who is here for revisit 10/01/2023 for  a face to face revisit to allow her to undergo a new sleep study. Her machine is issued after a titration study / 9-19-2017and cpap titration was on 10-14-2026 ,  nasal pillow air fit p 10 nasal medium size. Set at 7 cm water .  Chief concern according to patient :  I need a new machine  and my medicare status just allows me to get tested.   The patient continues compliantly to use her old CPAP machine, with a date compliance of 93% and an hour compliance of 75%.  So there are several days where she did not make the 4-hour mark.  On average she is using the machine 5 hours and 24 minutes.  CPAP is still set at 7 cm of water with 3 cm EPR and her residual AHI is 2.5/h is a higher component of central apnea than obstructive.  95th percentile air leak was 11.5 L a minute which is very typical for a nasal pillow mask.  Epworth sleepiness score today endorsed at 7 and the fatigue severity score endorsed at 22 points and her GDS at 1 out of 15 points.  She takes power naps most afternoons. These are less than 30 minutes long , refreshing for 2-3 hours.   Medication reviewed she is taking amlodipine, melatonin as a supplement at bedtime and metoprolol only 25 mg as needed for palpitation.   So overall there is no new health history. She just finished a 5 day Z pack, for sinusitis. .   She is retired and volunteers a lot, mother of 4 , and has 6 grandchildren,  non smoker, no ETOH, caffeine use : 1 cup- 2 cups in AM , no caffeine  usually after lunch.  Bedtime  is 10 Pm and waking always y up by 3 AM, often that's the end of the night. No RLS, no pain, no headaches. Not snoring on CPAP .       RV for Patricia Rangel: 03-27-2022; She reports being no longer happy with CPAP. 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 Her husband has had the same infection.  She reports not being able to get supplies now that the DME had changed to adapt.  She is due for anew machine, and yet unsure if she wants to continue treatment.  We discussed  her insomnia too. She is willing to undergo meditation treatment.  She reports vivid dreams, "crazy real ", and she has no sleep attacks. 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         RV 12-13-2019:  Patricia Rangel is a 68 y.o. female patient seen here for CPAP problems. Reports insomnia.  Refinance has for years woken up at 3 AM reflecting the demands of her work schedule.  She stopped taking sleep aids but also  stopped her CPAP but continued to struggle somewhat with sleep.  She feels that she only gets 1 to 2 hours of sleep at a time.  She developed  headaches, cluster headaches that second her because nausea.  CPAP did not change the HA pattern.  On the days that she wakes up with a headache it usually resolves by 5 PM.  She is restarted CPAP only recently and it does help her with some sleep but it still does not feel that her sleep is sufficient and it remains fragmented.  A download from her CPAP machine is available starting on 16 December and allowing therefore only an 11-day review or 12-day review each of these 12 days the patient uses CPAP 7 hours and 42 minutes on average, her set pressure of 7 cmH2O was forced with 3 cm EPR and her residual apneas are interestingly 50% central and 50% obstructive, she does not have major air leaks.  The central apnea index is 2.7/h on the obstructive 2.3/h. FSS 27,  Epworth SS Total = 1/ 24      Revisit on  09-09-2018. She was originally referred by Dr. Felipa Eth for a re-evaluation of sleep apnea, but now presents with insomnia and a possibly non working CPAP.   When I last met with Patricia Rangel she underwent an AutoSet re-titration and she is now using CPAP compliantly at 87% with an average use at time of 5 hours and 47 minutes, CPAP is set at 7 cmH2O with 3 cm expiratory pressure relief.  She has a residual apnea index of 4.4 of which 2.5 of central and 1.8 obstructive however his total AHI is satisfying. CPAP used compliantly since July 2018 improved her sleep overall but then she had developed more and more problems with insomnia.  There are some stressors.  There is a feeling of being wired- and thoughts intruding- unable to relax. OCD- ?     On August 25, 2018-  Patricia Rangel saw her primary care physician, Dr Felipa Eth,  and she again stated that she had trouble sleeping.  She has tried duloxetine-Cymbalta she was on Lexapro she had gotten a little bit of helped by alprazolam but the addictive potential meds is not a good choice.     Review of Systems: Out of a complete 14 system review, the patient complains of only the following symptoms, and all other reviewed systems are negative.:     How likely are you to doze in the following situations: 0 = not likely, 1 = slight chance, 2 = moderate chance, 3 = high chance   Sitting and Reading? Watching Television? Sitting inactive in a public place (theater or meeting)? As a passenger in a car for an hour without a break? Lying down in the afternoon when circumstances permit? Sitting and talking to someone? Sitting quietly after lunch without alcohol? In a car, while stopped for a few minutes in traffic?   Total = 7/ 24 points   FSS endorsed at 22/ 63 points. GDS 1 / 15   Social History   Socioeconomic History   Marital status: Married    Spouse name: Iantha Fallen   Number of children: 4   Years of education: Not on file   Highest education level:  Associate degree: academic program  Occupational History   Not on file  Tobacco Use   Smoking status: Never    Passive exposure: Past   Smokeless tobacco: Never  Vaping Use   Vaping status: Never Used  Substance and Sexual Activity   Alcohol use: No   Drug use: No   Sexual activity: Yes  Other Topics Concern   Not on file  Social History Narrative   Lives with husband and daughter   R handed   Caffeine: 120z C of coffee AM   Social Determinants of Health   Financial Resource Strain: Not on file  Food Insecurity: Not on file  Transportation Needs: Not on file  Physical Activity: Not on file  Stress: Not on file  Social Connections: Not on file    Family History  Problem Relation Age of Onset   Hypertension Mother    Thyroid disease Mother    Hypercholesterolemia Mother    Diabetes Father    Hypertension Father    Colon cancer Neg Hx     Past Medical History:  Diagnosis Date   Basal cell carcinoma    Headache    Hypertension    Mitral valve prolapse    Sleep apnea    cpap   SVT (supraventricular tachycardia) (HCC)     Past Surgical History:  Procedure Laterality Date   BACK SURGERY  1998   ROOT CANAL     TONSILLECTOMY  1994     Current Outpatient Medications on File Prior to Visit  Medication Sig Dispense Refill   amLODipine (NORVASC) 5 MG tablet Take 1 tablet (5 mg total) by mouth daily. 30 tablet 4   MELATONIN PO Take 1 each by mouth at bedtime.     ZITHROMAX Z-PAK 250 MG tablet Take 2 tablets the 1st day and 1 tablet daily for next 4 days as directed Orally for 5 days     metoprolol tartrate (LOPRESSOR) 25 MG tablet TAKE 1 TABLET (25 MG TOTAL) BY MOUTH AS NEEDED (PALPITATIONS). 90 tablet 1   No current facility-administered medications on file prior to visit.    Allergies  Allergen Reactions   Codeine Nausea Only   Doxycycline     Abdominal pain    Penicillins Hives   Sulfa Antibiotics Hives     DIAGNOSTIC DATA (LABS, IMAGING, TESTING) -  I reviewed patient records, labs, notes, testing and imaging myself where available.  Lab Results  Component Value Date   WBC 6.0 06/24/2022   HGB 14.0 06/24/2022   HCT 41.5 06/24/2022   MCV 91.0 06/24/2022   PLT 216 06/24/2022      Component Value Date/Time   NA 144 06/24/2022 2332   K 3.6 06/24/2022 2332   CL 108 06/24/2022 2332   CO2 27 06/24/2022 2332   GLUCOSE 109 (H) 06/24/2022 2332   BUN 9 06/24/2022 2332   CREATININE 0.58 06/24/2022 2332   CALCIUM 10.3 06/24/2022 2332   GFRNONAA >60 06/24/2022 2332   No results found for: "CHOL", "HDL", "LDLCALC", "LDLDIRECT", "TRIG", "CHOLHDL" No results found for: "HGBA1C" Lab Results  Component Value Date   VITAMINB12 413 08/05/2022   Lab Results  Component Value Date   TSH 1.640 08/05/2022   She has not yet had her full physical for the year.  She just became Medicare covered client.  Her birthday was [birth year].  PHYSICAL EXAM:  Today's Vitals   10/01/23 1247  BP: 114/73  Pulse: 64  Weight: 150 lb (68 kg)  Height: 5\' 9"  (1.753 m)   Body mass index is 22.15 kg/m.   Wt Readings from Last 3 Encounters:  10/01/23 150 lb (68 kg)  02/05/23 163 lb (73.9 kg)  11/05/22 162 lb 9.6 oz (73.8 kg)  Ht Readings from Last 3 Encounters:  10/01/23 5\' 9"  (1.753 m)  02/05/23 5\' 9"  (1.753 m)  08/05/22 5\' 9"  (1.753 m)      General: The patient is awake, alert and appears not in acute distress. The patient is well groomed. Head: Normocephalic, atraumatic. Neck is supple. Mallampati 3- neck circumference:15.25 .  Nasal airflow patent , TMJ is not evident . Retrognathia is seen.  Cardiovascular:  Regular rate and rhythm,  Respiratory: Lungs are clear to auscultation.     Neurologic exam :The patient is awake and alert, oriented to place and time.   Memory subjective described as intact.  Attention span & concentration ability appears normal.  Speech is fluent, with a nasal speech, but  talkative are appropriate.   Cranial  nerves: taste and smell intact.  Pupils are equal and briskly reactive to light.  Visual fields by finger perimetry are intact.  Hearing to soft voice is  mildly reduced. Tinnitus.  Facial sensation intact to fine touch. Facial motor strength is symmetric and tongue and uvula move midline. Shoulder shrug was symmetrical.    Motor exam: Normal tone, muscle bulk and symmetric strength in all extremities. Sensory:  Fine touch, pinprick and vibration was normal. Coordination: Finger-to-nose maneuver  normal without evidence of ataxia, dysmetria or tremor. Gait and station: Patient walks without assistive device. Deep tendon reflexes: in the  upper and lower extremities are symmetric at 2 plus and intact.  ASSESSMENT AND PLAN 67 y.o. year old female  here with:    1) currently recovering from bacterial sinusitis and having associated headaches, jaw pain,eye pressure. Cannot use CPAP right now until the infection has cleared.   2) needs a new CPAP. I will order a HST for her , this to get a new baseline, her new HST will be following CMS criteria.   Had mild apnea by AASM  criteria and couldn't get HST covered before she turned 67, now on CMS criteria. Needs new CPAP , will encourage her to to use HST for at least 5 hours.   3) sleeps better and feels better with CPAP and wants to continue Therapy.     I plan to follow up either personally or through our NP within 3-5 months.    I would like to thank  Chilton Greathouse, Md [address] [place],  [place] [address] for allowing me to meet with and to take care of this pleasant patient.    After spending a total time of  30  minutes face to face and additional time for physical and neurologic examination, review of laboratory studies,  personal review of imaging studies, reports and results of other testing and review of referral information / records as far as provided in visit,   Electronically signed by: Melvyn Novas, MD 10/01/2023  1:12 PM  [facility] Board certified by The [employer] of Sleep Medicine and Diplomate of the [employer] of [employer] Medicine. Board certified In Neurology through the ABPN, Fellow of the [employer] of [employer].

## 2023-10-01 NOTE — Patient Instructions (Addendum)
SSESSMENT AND PLAN 67 y.o. year old female  here with:    1) currently recovering from bacterial sinusitis and having associated headaches, jaw pain,eye pressure. Cannot use CPAP right now until the infection has cleared.   2) needs a new CPAP. I will order a HST for her , this to get a new baseline, her new HST will be following CMS criteria.   Had mild apnea by AASM  criteria and couldn't get HST covered before she turned 67, now on CMS criteria. Needs new CPAP , will encourage her to to use HST for at least 5 hours.      CPAP and BIPAP Information CPAP and BIPAP are methods that use air pressure to keep your airways open and to help you breathe well. CPAP and BIPAP use different amounts of pressure. Your health care provider will tell you whether CPAP or BIPAP would be more helpful for you. CPAP stands for "continuous positive airway pressure." With CPAP, the amount of pressure stays the same while you breathe in (inhale) and out (exhale). BIPAP stands for "bi-level positive airway pressure." With BIPAP, the amount of pressure will be higher when you inhale and lower when you exhale. This allows you to take larger breaths. CPAP or BIPAP may be used in the hospital, or your health care provider may want you to use it at home. You may need to have a sleep study before your health care provider can order a machine for you to use at home. What are the advantages? CPAP or BIPAP can be helpful if you have: Sleep apnea. Chronic obstructive pulmonary disease (COPD). Heart failure. Medical conditions that cause muscle weakness, including muscular dystrophy or amyotrophic lateral sclerosis (ALS). Other problems that cause breathing to be shallow, weak, abnormal, or difficult. CPAP and BIPAP are most commonly used for obstructive sleep apnea (OSA) to keep the airways from collapsing when the muscles relax during sleep. What are the risks? Generally, this is a safe treatment. However, problems may  occur, including: Irritated skin or skin sores if the mask does not fit properly. Dry or stuffy nose or nosebleeds. Dry mouth. Feeling gassy or bloated. Sinus or lung infection if the equipment is not cleaned properly. When should CPAP or BIPAP be used? In most cases, the mask only needs to be worn during sleep. Generally, the mask needs to be worn throughout the night and during any daytime naps. People with certain medical conditions may also need to wear the mask at other times, such as when they are awake. Follow instructions from your health care provider about when to use the machine. What happens during CPAP or BIPAP?  Both CPAP and BIPAP are provided by a small machine with a flexible plastic tube that attaches to a plastic mask that you wear. Air is blown through the mask into your nose or mouth. The amount of pressure that is used to blow the air can be adjusted on the machine. Your health care provider will set the pressure setting and help you find the best mask for you. Tips for using the mask Because the mask needs to be snug, some people feel trapped or closed-in (claustrophobic) when first using the mask. If you feel this way, you may need to get used to the mask. One way to do this is to hold the mask loosely over your nose or mouth and then gradually apply the mask more snugly. You can also gradually increase the amount of time that you use  the mask. Masks are available in various types and sizes. If your mask does not fit well, talk with your health care provider about getting a different one. Some common types of masks include: Full face masks, which fit over the mouth and nose. Nasal masks, which fit over the nose. Nasal pillow or prong masks, which fit into the nostrils. If you are using a mask that fits over your nose and you tend to breathe through your mouth, a chin strap may be applied to help keep your mouth closed. Use a skin barrier to protect your skin as told by your  health care provider. Some CPAP and BIPAP machines have alarms that may sound if the mask comes off or develops a leak. If you have trouble with the mask, it is very important that you talk with your health care provider about finding a way to make the mask easier to tolerate. Do not stop using the mask. There could be a negative impact on your health if you stop using the mask. Tips for using the machine Place your CPAP or BIPAP machine on a secure table or stand near an electrical outlet. Know where the on/off switch is on the machine. Follow instructions from your health care provider about how to set the pressure on your machine and when you should use it. Do not eat or drink while the CPAP or BIPAP machine is on. Food or fluids could get pushed into your lungs by the pressure of the CPAP or BIPAP. For home use, CPAP and BIPAP machines can be rented or purchased through home health care companies. Many different brands of machines are available. Renting a machine before purchasing may help you find out which particular machine works well for you. Your health insurance company may also decide which machine you may get. Keep the CPAP or BIPAP machine and attachments clean. Ask your health care provider for specific instructions. Check the humidifier if you have a dry stuffy nose or nosebleeds. Make sure it is working correctly. Follow these instructions at home: Take over-the-counter and prescription medicines only as told by your health care provider. Ask if you can take sinus medicine if your sinuses are blocked. Do not use any products that contain nicotine or tobacco. These products include cigarettes, chewing tobacco, and vaping devices, such as e-cigarettes. If you need help quitting, ask your health care provider. Keep all follow-up visits. This is important. Contact a health care provider if: You have redness or pressure sores on your head, face, mouth, or nose from the mask or head  gear. You have trouble using the CPAP or BIPAP machine. You cannot tolerate wearing the CPAP or BIPAP mask. Someone tells you that you snore even when wearing your CPAP or BIPAP. Get help right away if: You have trouble breathing. You feel confused. Summary CPAP and BIPAP are methods that use air pressure to keep your airways open and to help you breathe well. If you have trouble with the mask, it is very important that you talk with your health care provider about finding a way to make the mask easier to tolerate. Do not stop using the mask. There could be a negative impact to your health if you stop using the mask. Follow instructions from your health care provider about when to use the machine. This information is not intended to replace advice given to you by your health care provider. Make sure you discuss any questions you have with your health care provider.  Document Revised: 07/10/2021 Document Reviewed: 11/09/2020 Elsevier Patient Education  2023 Elsevier Inc. Sleep Apnea Sleep apnea affects breathing during sleep. It causes breathing to stop for 10 seconds or more, or to become shallow. People with sleep apnea usually snore loudly. It can also increase the risk of: Heart attack. Stroke. Being very overweight (obese). Diabetes. Heart failure. Irregular heartbeat. High blood pressure. The goal of treatment is to help you breathe normally again. What are the causes?  The most common cause of this condition is a collapsed or blocked airway. There are three kinds of sleep apnea: Obstructive sleep apnea. This is caused by a blocked or collapsed airway. Central sleep apnea. This happens when the brain does not send the right signals to the muscles that control breathing. Mixed sleep apnea. This is a combination of obstructive and central sleep apnea. What increases the risk? Being overweight. Smoking. Having a small airway. Being older. Being female. Drinking alcohol. Taking  medicines to calm yourself (sedatives or tranquilizers). Having family members with the condition. Having a tongue or tonsils that are larger than normal. What are the signs or symptoms? Trouble staying asleep. Loud snoring. Headaches in the morning. Waking up gasping. Dry mouth or sore throat in the morning. Being sleepy or tired during the day. If you are sleepy or tired during the day, you may also: Not be able to focus your mind (concentrate). Forget things. Get angry a lot and have mood swings. Feel sad (depressed). Have changes in your personality. Have less interest in sex, if you are female. Be unable to have an erection, if you are female. How is this treated?  Sleeping on your side. Using a medicine to get rid of mucus in your nose (decongestant). Avoiding the use of alcohol, medicines to help you relax, or certain pain medicines (narcotics). Losing weight, if needed. Changing your diet. Quitting smoking. Using a machine to open your airway while you sleep, such as: An oral appliance. This is a mouthpiece that shifts your lower jaw forward. A CPAP device. This device blows air through a mask when you breathe out (exhale). An EPAP device. This has valves that you put in each nostril. A BIPAP device. This device blows air through a mask when you breathe in (inhale) and breathe out. Having surgery if other treatments do not work. Follow these instructions at home: Lifestyle Make changes that your doctor recommends. Eat a healthy diet. Lose weight if needed. Avoid alcohol, medicines to help you relax, and some pain medicines. Do not smoke or use any products that contain nicotine or tobacco. If you need help quitting, ask your doctor. General instructions Take over-the-counter and prescription medicines only as told by your doctor. If you were given a machine to use while you sleep, use it only as told by your doctor. If you are having surgery, make sure to tell your  doctor you have sleep apnea. You may need to bring your device with you. Keep all follow-up visits. Contact a doctor if: The machine that you were given to use during sleep bothers you or does not seem to be working. You do not get better. You get worse. Get help right away if: Your chest hurts. You have trouble breathing in enough air. You have an uncomfortable feeling in your back, arms, or stomach. You have trouble talking. One side of your body feels weak. A part of your face is hanging down. These symptoms may be an emergency. Get help right away. Call your  local emergency services (911 in the [place] Do not wait to see if the symptoms will go away. Do not drive yourself to the hospital. Summary This condition affects breathing during sleep. The most common cause is a collapsed or blocked airway. The goal of treatment is to help you breathe normally while you sleep. This information is not intended to replace advice given to you by your health care provider. Make sure you discuss any questions you have with your health care provider. Document Revised: 07/10/2021 Document Reviewed: 11/09/2020 Elsevier Patient Education  2024 [employer].

## 2023-10-05 ENCOUNTER — Telehealth: Payer: Self-pay | Admitting: Neurology

## 2023-10-05 NOTE — Telephone Encounter (Signed)
HST- HTA pending faxed notes.

## 2023-10-12 NOTE — Telephone Encounter (Signed)
HST HTA Berkley Harvey: [id number] (Exp. 10/05/23 to 01/03/24)

## 2023-10-20 DIAGNOSIS — M47816 Spondylosis without myelopathy or radiculopathy, lumbar region: Secondary | ICD-10-CM | POA: Diagnosis not present

## 2023-10-20 DIAGNOSIS — M419 Scoliosis, unspecified: Secondary | ICD-10-CM | POA: Diagnosis not present

## 2023-10-20 DIAGNOSIS — M5442 Lumbago with sciatica, left side: Secondary | ICD-10-CM | POA: Diagnosis not present

## 2023-10-20 DIAGNOSIS — G4700 Insomnia, unspecified: Secondary | ICD-10-CM | POA: Diagnosis not present

## 2023-10-20 DIAGNOSIS — M48061 Spinal stenosis, lumbar region without neurogenic claudication: Secondary | ICD-10-CM | POA: Diagnosis not present

## 2023-10-20 DIAGNOSIS — I10 Essential (primary) hypertension: Secondary | ICD-10-CM | POA: Diagnosis not present

## 2023-10-27 DIAGNOSIS — G4733 Obstructive sleep apnea (adult) (pediatric): Secondary | ICD-10-CM | POA: Diagnosis not present

## 2023-10-28 ENCOUNTER — Encounter: Payer: Self-pay | Admitting: Neurology

## 2023-11-05 ENCOUNTER — Ambulatory Visit: Payer: HMO

## 2023-11-05 VITALS — Ht 69.0 in | Wt 146.0 lb

## 2023-11-05 DIAGNOSIS — Z1211 Encounter for screening for malignant neoplasm of colon: Secondary | ICD-10-CM

## 2023-11-05 MED ORDER — NA SULFATE-K SULFATE-MG SULF 17.5-3.13-1.6 GM/177ML PO SOLN: 1.0000 | Freq: Once | ORAL | 0 refills | Status: AC

## 2023-11-05 NOTE — Progress Notes (Signed)
No egg or soy allergy known to patient  No issues known to pt with past sedation with any surgeries or procedures Patient denies ever being told they had issues or difficulty with intubation  No FH of Malignant Hyperthermia Pt is not on diet pills Pt is not on  home 02  Pt is not on blood thinners  Pt denies issues with chronic constipation  No A fib or A flutter Have any cardiac testing pending--no Patient's chart reviewed by Cathlyn Parsons CNRA prior to previsit and patient appropriate for the [facility].  Previsit completed and red dot placed by patient's name on their procedure day (on provider's schedule).    Pt instructed to use [email or web address].[email or web address] or GoodRx for a price reduction on prep  Ambulates independently

## 2023-11-17 ENCOUNTER — Ambulatory Visit: Payer: HMO | Admitting: Neurology

## 2023-11-17 DIAGNOSIS — F518 Other sleep disorders not due to a substance or known physiological condition: Secondary | ICD-10-CM

## 2023-11-17 DIAGNOSIS — G44009 Cluster headache syndrome, unspecified, not intractable: Secondary | ICD-10-CM

## 2023-11-17 DIAGNOSIS — R002 Palpitations: Secondary | ICD-10-CM

## 2023-11-17 DIAGNOSIS — G4733 Obstructive sleep apnea (adult) (pediatric): Secondary | ICD-10-CM

## 2023-11-23 ENCOUNTER — Encounter: Payer: Self-pay | Admitting: Gastroenterology

## 2023-11-26 ENCOUNTER — Telehealth: Payer: Self-pay | Admitting: Gastroenterology

## 2023-11-26 DIAGNOSIS — G4733 Obstructive sleep apnea (adult) (pediatric): Secondary | ICD-10-CM | POA: Diagnosis not present

## 2023-11-26 NOTE — Telephone Encounter (Signed)
New prep instructions sent via mychart. New packet to be mailed to address on file

## 2023-11-26 NOTE — Telephone Encounter (Signed)
Please update prep instructions. 

## 2023-11-30 ENCOUNTER — Encounter: Payer: HMO | Admitting: Gastroenterology

## 2023-11-30 ENCOUNTER — Encounter: Payer: HMO | Admitting: Internal Medicine

## 2023-12-02 ENCOUNTER — Encounter: Payer: HMO | Admitting: Gastroenterology

## 2023-12-07 NOTE — Telephone Encounter (Signed)
Patient had to repeat SS, She is scheduled for 01/05/24 the auth will expire before then.  I submitted for new auth.

## 2023-12-10 NOTE — Telephone Encounter (Signed)
This encounter was created in error - please disregard.

## 2023-12-21 NOTE — Telephone Encounter (Signed)
 Updated Berkley HarveyNonnie Done: [id number] (exp. 01/05/24 to 04/04/24)   Patient is scheduled at [facility] for 01/05/24 at 10:30 AM.

## 2023-12-27 DIAGNOSIS — G4733 Obstructive sleep apnea (adult) (pediatric): Secondary | ICD-10-CM | POA: Diagnosis not present

## 2023-12-28 ENCOUNTER — Ambulatory Visit: Payer: HMO | Admitting: Gastroenterology

## 2023-12-28 ENCOUNTER — Encounter: Payer: Self-pay | Admitting: Gastroenterology

## 2023-12-28 VITALS — BP 129/80 | HR 68 | Temp 98.2°F | Resp 15 | Ht 69.0 in | Wt 146.0 lb

## 2023-12-28 DIAGNOSIS — K5730 Diverticulosis of large intestine without perforation or abscess without bleeding: Secondary | ICD-10-CM

## 2023-12-28 DIAGNOSIS — K641 Second degree hemorrhoids: Secondary | ICD-10-CM | POA: Diagnosis not present

## 2023-12-28 DIAGNOSIS — K5140 Inflammatory polyps of colon without complications: Secondary | ICD-10-CM

## 2023-12-28 DIAGNOSIS — D125 Benign neoplasm of sigmoid colon: Secondary | ICD-10-CM | POA: Diagnosis not present

## 2023-12-28 DIAGNOSIS — K621 Rectal polyp: Secondary | ICD-10-CM | POA: Diagnosis not present

## 2023-12-28 DIAGNOSIS — D120 Benign neoplasm of cecum: Secondary | ICD-10-CM

## 2023-12-28 DIAGNOSIS — Z1211 Encounter for screening for malignant neoplasm of colon: Secondary | ICD-10-CM | POA: Diagnosis not present

## 2023-12-28 MED ORDER — SODIUM CHLORIDE 0.9 % IV SOLN: 500.0000 mL | Freq: Once | INTRAVENOUS | Status: DC

## 2023-12-28 NOTE — Progress Notes (Signed)
 GASTROENTEROLOGY PROCEDURE H&P NOTE   Primary Care Physician: Avva, Ravisankar, MD    Reason for Procedure:  Colon Cancer screening  Plan:    Colonoscopy  Patient is appropriate for endoscopic procedure(s) in the ambulatory ([facility]) setting.  The nature of the procedure, as well as the risks, benefits, and alternatives were carefully and thoroughly reviewed with the patient. Ample time for discussion and questions allowed. The patient understood, was satisfied, and agreed to proceed.     HPI: Patricia Rangel is a 68 y.o. female who presents for colonoscopy for routine Colon Cancer screening.  No active GI symptoms.  No known family history of colon cancer or related malignancy.  Patient is otherwise without complaints or active issues today.  Last colonoscopy was 08/2013 and notable for a diminutive sigmoid hyperplastic polyp, small internal hemorrhoids with recommendation to repeat in 10 years.  Past Medical History:  Diagnosis Date   Basal cell carcinoma    Headache    Hypertension    Mitral valve prolapse    Sleep apnea    cpap   SVT (supraventricular tachycardia) [facility])     Past Surgical History:  Procedure Laterality Date   BACK SURGERY  12/15/1996   COLONOSCOPY  2014   ROOT CANAL     TONSILLECTOMY  12/15/1992    Prior to Admission medications   Medication Sig Start Date End Date Taking? Authorizing Provider  amLODipine  (NORVASC ) 5 MG tablet Take 1 tablet (5 mg total) by mouth daily. 09/29/23 09/23/24 Yes Lonni Slain, MD  MELATONIN PO Take 1 each by mouth at bedtime.   Yes [provider]  metoprolol  tartrate (LOPRESSOR ) 25 MG tablet TAKE 1 TABLET (25 MG TOTAL) BY MOUTH AS NEEDED (PALPITATIONS). 09/22/22 02/05/23  Vannie Reche RAMAN, NP    Current Outpatient Medications  Medication Sig Dispense Refill   amLODipine  (NORVASC ) 5 MG tablet Take 1 tablet (5 mg total) by mouth daily. 30 tablet 4   MELATONIN PO Take 1 each by mouth at bedtime.      metoprolol  tartrate (LOPRESSOR ) 25 MG tablet TAKE 1 TABLET (25 MG TOTAL) BY MOUTH AS NEEDED (PALPITATIONS). 90 tablet 1   Current Facility-Administered Medications  Medication Dose Route Frequency Provider Last Rate Last Admin   0.9 %  sodium chloride  infusion  500 mL Intravenous Once Jihad Brownlow V, DO        Allergies as of 12/28/2023 - Review Complete 12/28/2023  Allergen Reaction Noted   Codeine Nausea Only 02/05/2021   Doxycycline   02/19/2021   Penicillins Hives 08/26/2013   Sulfa antibiotics Hives 08/26/2013    Family History  Problem Relation Age of Onset   Hypertension Mother    Thyroid  disease Mother    Hypercholesterolemia Mother    Diabetes Father    Hypertension Father    Colon cancer Neg Hx    Esophageal cancer Neg Hx    Rectal cancer Neg Hx    Stomach cancer Neg Hx    Colon polyps Neg Hx     Social History   Socioeconomic History   Marital status: Married    Spouse name: [name]   Number of children: 4   Years of education: Not on file   Highest education level: Associate degree: academic program  Occupational History   Not on file  Tobacco Use   Smoking status: Never    Passive exposure: Past   Smokeless tobacco: Never  Vaping Use   Vaping status: Never Used  Substance and Sexual Activity  Alcohol  use: No   Drug use: No   Sexual activity: Yes  Other Topics Concern   Not on file  Social History Narrative   Lives with husband and daughter   R handed   Caffeine: 120z C of coffee AM   Social Drivers of Corporate Investment Banker Strain: Not on file  Food Insecurity: Not on file  Transportation Needs: Not on file  Physical Activity: Not on file  Stress: Not on file  Social Connections: Not on file  Intimate Partner Violence: Not on file    Physical Exam: Vital signs in last 24 hours: @BP  138/78   Pulse 61   Temp 98.2 F (36.8 C)   Resp 17   Ht 5' 9 (1.753 m)   Wt 146 lb (66.2 kg)   SpO2 95%   BMI 21.56 kg/m  GEN:  NAD EYE: Sclerae anicteric ENT: MMM CV: Non-tachycardic Pulm: CTA b/l GI: Soft, NT/ND NEURO:  Alert & Oriented x 3   Sandor Flatter, DO [facility]   12/28/2023 1:49 PM

## 2023-12-28 NOTE — Patient Instructions (Addendum)
 Resume previous diet and medications.  Handout provided on polyps, diverticulosis, and hemorrhoids.  Follow up colonoscopy based on biopsy results.    YOU HAD AN ENDOSCOPIC PROCEDURE TODAY AT THE [facility]:   Refer to the procedure report that was given to you for any specific questions about what was found during the examination.  If the procedure report does not answer your questions, please call your gastroenterologist to clarify.  If you requested that your care partner not be given the details of your procedure findings, then the procedure report has been included in a sealed envelope for you to review at your convenience later.  YOU SHOULD EXPECT: Some feelings of bloating in the abdomen. Passage of more gas than usual.  Walking can help get rid of the air that was put into your GI tract during the procedure and reduce the bloating. If you had a lower endoscopy (such as a colonoscopy or flexible sigmoidoscopy) you may notice spotting of blood in your stool or on the toilet paper. If you underwent a bowel prep for your procedure, you may not have a normal bowel movement for a few days.  Please Note:  You might notice some irritation and congestion in your nose or some drainage.  This is from the oxygen used during your procedure.  There is no need for concern and it should clear up in a day or so.  SYMPTOMS TO REPORT IMMEDIATELY:  Following lower endoscopy (colonoscopy or flexible sigmoidoscopy):  Excessive amounts of blood in the stool  Significant tenderness or worsening of abdominal pains  Swelling of the abdomen that is new, acute  Fever of 100F or higher  For urgent or emergent issues, a gastroenterologist can be reached at any hour by calling [phone]. Do not use MyChart messaging for urgent concerns.    DIET:  We do recommend a small meal at first, but then you may proceed to your regular diet.  Drink plenty of fluids but you should avoid alcoholic beverages for  24 hours.  ACTIVITY:  You should plan to take it easy for the rest of today and you should NOT DRIVE or use heavy machinery until tomorrow (because of the sedation medicines used during the test).    FOLLOW UP: Our staff will call the number listed on your records the next business day following your procedure.  We will call around 7:15- 8:00 am to check on you and address any questions or concerns that you may have regarding the information given to you following your procedure. If we do not reach you, we will leave a message.     If any biopsies were taken you will be contacted by phone or by letter within the next 1-3 weeks.  Please call us  at [phone] if you have not heard about the biopsies in 3 weeks.    SIGNATURES/CONFIDENTIALITY: You and/or your care partner have signed paperwork which will be entered into your electronic medical record.  These signatures attest to the fact that that the information above on your After Visit Summary has been reviewed and is understood.  Full responsibility of the confidentiality of this discharge information lies with you and/or your care-partner.

## 2023-12-28 NOTE — Op Note (Signed)
 [facility] Patient Name: Patricia Rangel Procedure Date: 12/28/2023 1:25 PM MRN: [id number] Endoscopist: Sandor Flatter , MD, [id number] Age: 68 Referring MD:  Date of Birth: 1956/02/10 Gender: Female Account #: [id number] Procedure:                Colonoscopy Indications:              Screening for colorectal malignant neoplasm (last                            colonoscopy was 10 years ago).                           Last colonoscopy was 08/2013 and notable for a                            diminutive sigmoid hyperplastic polyp, small                            internal hemorrhoids Medicines:                Monitored Anesthesia Care Procedure:                Pre-Anesthesia Assessment:                           - Prior to the procedure, a History and Physical                            was performed, and patient medications and                            allergies were reviewed. The patient's tolerance of                            previous anesthesia was also reviewed. The risks                            and benefits of the procedure and the sedation                            options and risks were discussed with the patient.                            All questions were answered, and informed consent                            was obtained. Prior Anticoagulants: The patient has                            taken no anticoagulant or antiplatelet agents. ASA                            Grade Assessment: II - A patient with mild systemic  disease. After reviewing the risks and benefits,                            the patient was deemed in satisfactory condition to                            undergo the procedure.                           After obtaining informed consent, the colonoscope                            was passed under direct vision. Throughout the                            procedure, the patient's blood pressure, pulse, and                             oxygen saturations were monitored continuously. The                            Olympus Scope SN: [id number] was introduced through                            the anus and advanced to the the terminal ileum.                            The colonoscopy was performed without difficulty.                            The patient tolerated the procedure well. The                            quality of the bowel preparation was good. The                            terminal ileum, ileocecal valve, appendiceal                            orifice, and rectum were photographed. Scope In: 1:53:41 PM Scope Out: 2:18:31 PM Scope Withdrawal Time: 0 hours 17 minutes 7 seconds  Total Procedure Duration: 0 hours 24 minutes 50 seconds  Findings:                 The perianal and digital rectal examinations were                            normal.                           A 5 mm polyp was found in the cecum. The polyp was                            sessile. The polyp was removed with a cold snare.  Resection and retrieval were complete. Estimated                            blood loss was minimal.                           Two sessile polyps were found in the sigmoid colon.                            The polyps were 3 to 5 mm in size. These polyps                            were removed with a cold snare. Resection and                            retrieval were complete. Estimated blood loss was                            minimal.                           Six sessile polyps were found in the rectum. The                            polyps were 2 to 3 mm in size. These polyps were                            removed with a cold snare. Resection and retrieval                            were complete. Estimated blood loss was minimal.                           A few small-mouthed diverticula were found in the                            sigmoid colon.                            Non-bleeding internal hemorrhoids were found during                            retroflexion. The hemorrhoids were small and Grade                            II (internal hemorrhoids that prolapse but reduce                            spontaneously).                           The terminal ileum appeared normal. Complications:            No immediate complications. Estimated Blood Loss:     Estimated blood loss was minimal. Impression:               -  One 5 mm polyp in the cecum, removed with a cold                            snare. Resected and retrieved.                           - Two 3 to 5 mm polyps in the sigmoid colon,                            removed with a cold snare. Resected and retrieved.                           - Six 2 to 3 mm polyps in the rectum, removed with                            a cold snare. Resected and retrieved.                           - Diverticulosis in the sigmoid colon.                           - Non-bleeding internal hemorrhoids.                           - The examined portion of the ileum was normal. Recommendation:           - Patient has a contact number available for                            emergencies. The signs and symptoms of potential                            delayed complications were discussed with the                            patient. Return to normal activities tomorrow.                            Written discharge instructions were provided to the                            patient.                           - Resume previous diet.                           - Continue present medications.                           - Await pathology results.                           - Repeat colonoscopy for surveillance based on  pathology results.                           - Return to GI office PRN. Sandor Flatter, MD 12/28/2023 2:26:05 PM

## 2023-12-28 NOTE — Progress Notes (Signed)
 Called to room to assist during endoscopic procedure.  Patient ID and intended procedure confirmed with present staff. Received instructions for my participation in the procedure from the performing physician.

## 2023-12-28 NOTE — Progress Notes (Signed)
 Pt's states no medical or surgical changes since previsit or office visit.

## 2023-12-28 NOTE — Progress Notes (Signed)
 To pacu, VSS. Report to RN.tb

## 2023-12-29 ENCOUNTER — Telehealth: Payer: Self-pay | Admitting: *Deleted

## 2023-12-29 NOTE — Telephone Encounter (Signed)
  Follow up Call-     12/28/2023   12:51 PM  Call back number  Post procedure Call Back phone  # [phone]  Permission to leave phone message Yes     Patient questions:  Do you have a fever, pain , or abdominal swelling? Yes.   Pain Score  0 *  Have you tolerated food without any problems? Yes.    Have you been able to return to your normal activities? Yes.    Do you have any questions about your discharge instructions: Diet   No. Medications  No. Follow up visit  No.  Do you have questions or concerns about your Care? Yes.    Actions: * If pain score is 4 or above: No action needed, pain <4.

## 2023-12-31 ENCOUNTER — Ambulatory Visit: Payer: HMO | Admitting: Neurology

## 2023-12-31 ENCOUNTER — Encounter: Payer: Self-pay | Admitting: Neurology

## 2023-12-31 VITALS — BP 128/77 | HR 66 | Ht 69.0 in | Wt 161.2 lb

## 2023-12-31 DIAGNOSIS — G44019 Episodic cluster headache, not intractable: Secondary | ICD-10-CM | POA: Diagnosis not present

## 2023-12-31 DIAGNOSIS — G4733 Obstructive sleep apnea (adult) (pediatric): Secondary | ICD-10-CM | POA: Diagnosis not present

## 2023-12-31 DIAGNOSIS — F518 Other sleep disorders not due to a substance or known physiological condition: Secondary | ICD-10-CM | POA: Diagnosis not present

## 2023-12-31 DIAGNOSIS — Q8789 Other specified congenital malformation syndromes, not elsewhere classified: Secondary | ICD-10-CM

## 2023-12-31 LAB — SURGICAL PATHOLOGY

## 2023-12-31 NOTE — Progress Notes (Signed)
Provider:  Melvyn Novas, MD  Primary Care Physician:  Patricia Greathouse, MD [address] [place] [address]     Referring Provider: Chilton Greathouse, Md [address] [place],  [place] [address]          Chief Complaint according to patient   Patient presents with:     Chronic insomnia  Patient (Initial Visit)           HISTORY OF PRESENT ILLNESS:  Patricia Rangel is a 68 y.o. female patient who is here for revisit 12/31/2023 for  CPAP  and struggles to get 4 hours of sleep. No sleepiness in daytime,  suffers from Insomnia. No family disposition. All her life  she had trouble to get 5 hours or ore of sleep.  She desires to sleep better, but above all to sleep longer- ! She had recently a colonoscopy and felt she woke up in the process, but no problems with recovery  Chief concern according to patient :  HST to be repeated, the device  failed.  So Mrs. Tired's compliance with CPAP has been 100% for days and hours with an average of 4 hours and 50 minutes.  She is using her AirSense 11 AutoSet between 5 and 12 cm of water with 3 cm water pressure reach by her Tory relief.  Her residual AHI is 1.3/h which is an excellent resolution and her 95th percentile pressure is 8.3 cm her air leak is 4.1 L a minute.  This is a very good result.  Her Epworth sleepiness score was endorsed at 10 points and her fatigue severity score was endorsed at 22 out of 63 points, her depression score geriatric was endorsed at 1 point out of 15.     Patricia Rangel is a 68 y.o. female patient who is here for revisit 10/01/2023 for  a face to face revisit to allow her to undergo a new sleep study. Her machine is issued after a titration study / 9-19-2017and cpap titration was on 10-14-2026 ,  nasal pillow air fit p 10 nasal medium size. Set at 7 cm water .  Chief concern according to patient :  I need a new machine  and my medicare status just allows me to get tested.    The patient continues  compliantly to use her old CPAP machine, with a date compliance of 93% and an hour compliance of 75%.  So there are several days where she did not make the 4-hour mark.  On average she is using the machine 5 hours and 24 minutes.  CPAP is still set at 7 cm of water with 3 cm EPR and her residual AHI is 2.5/h is a higher component of central apnea than obstructive.  95th percentile air leak was 11.5 L a minute which is very typical for a nasal pillow mask.   Epworth sleepiness score today endorsed at 7 and the fatigue severity score endorsed at 22 points and her GDS at 1 out of 15 points.   She takes power naps most afternoons. These are less than 30 minutes long , refreshing for 2-3 hours.   RV for Patricia Rangel: 03-27-2022; She reports being no longer happy with CPAP. 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 Her husband has had the same infection.  She reports not being able to get supplies now that the  DME had changed to adapt.  She is due for anew machine, and yet unsure if she wants to continue treatment.  We discussed  her insomnia too. She is willing to undergo meditation treatment.  She reports vivid dreams, "crazy real ", and she has no sleep attacks. 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         Review of Systems: Out of a complete 14 system review, the patient complains of only the following symptoms, and all other reviewed systems are negative.:  Tinnitus, pulsatile  How likely are you to doze in the following situations: 0 = not likely, 1 = slight chance, 2 = moderate chance, 3 = high chance   Sitting and Reading? Watching Television? Sitting inactive in a public place (theater or meeting)? As a passenger in a car for an hour without a break? Lying down in the afternoon when circumstances permit? Sitting and talking to someone? Sitting quietly after lunch without alcohol? In a car, while stopped for  a few minutes in traffic?   Her Epworth sleepiness score was endorsed at 10 points and her fatigue severity score was endorsed at 22 out of 63 points, her depression score geriatric was endorsed at 1 point out of 15.  No family history of short sleeper syndrome.  Social History   Socioeconomic History   Marital status: Married    Spouse name: Patricia Rangel   Number of children: 4   Years of education: Not on file   Highest education level: Associate degree: academic program  Occupational History   Not on file  Tobacco Use   Smoking status: Never    Passive exposure: Past   Smokeless tobacco: Never  Vaping Use   Vaping status: Never Used  Substance and Sexual Activity   Alcohol use: No   Drug use: No   Sexual activity: Yes  Other Topics Concern   Not on file  Social History Narrative   Lives with husband and daughter   R handed   Caffeine: 120z C of coffee AM   Social Drivers of Corporate investment banker Strain: Not on file  Food Insecurity: Not on file  Transportation Needs: Not on file  Physical Activity: Not on file  Stress: Not on file  Social Connections: Not on file    Family History  Problem Relation Age of Onset   Hypertension Mother    Thyroid disease Mother    Hypercholesterolemia Mother    Diabetes Father    Hypertension Father    Colon cancer Neg Hx    Esophageal cancer Neg Hx    Rectal cancer Neg Hx    Stomach cancer Neg Hx    Colon polyps Neg Hx     Past Medical History:  Diagnosis Date   Basal cell carcinoma    Headache    Hypertension    Mitral valve prolapse    Sleep apnea    cpap   SVT (supraventricular tachycardia) (HCC)     Past Surgical History:  Procedure Laterality Date   BACK SURGERY  12/15/1996   COLONOSCOPY  2014   ROOT CANAL     TONSILLECTOMY  12/15/1992     Current Outpatient Medications on File Prior to Visit  Medication Sig Dispense Refill   amLODipine (NORVASC) 5 MG tablet Take 1 tablet (5 mg total) by mouth daily.  30 tablet 4   MELATONIN PO Take 1 each by mouth at bedtime.     metoprolol tartrate (LOPRESSOR) 25 MG  tablet TAKE 1 TABLET (25 MG TOTAL) BY MOUTH AS NEEDED (PALPITATIONS). 90 tablet 1   No current facility-administered medications on file prior to visit.   FINDINGS: Brain:   No age advanced or lobar predominant parenchymal atrophy.   Multifocal T2 FLAIR hyperintense signal abnormality within the cerebral white matter, pons and cerebellum, overall mild-to-moderate in severity for age.   No evidence of an intracranial mass. Specifically, no cerebellopontine angle or internal auditory canal mass is demonstrated. Unremarkable appearance of the 7th and 8th cranial nerves bilaterally.   There is no acute infarct.   No chronic intracranial blood products.   No extra-axial fluid collection.   No midline shift.   No pathologic intracranial enhancement identified.   Vascular: Maintained flow voids within the proximal large arterial vessels.   Skull and upper cervical spine: No focal suspicious marrow lesion.   Sinuses/Orbits: No mass or acute finding within the imaged orbits. No significant paranasal sinus disease.   IMPRESSION: 1.  No evidence of an acute intracranial abnormality. 2. No cerebellopontine angle or internal auditory canal mass. 3. No specific etiology of tinnitus is identified. 4. Multifocal T2 FLAIR hyperintense signal abnormality within the cerebral white matter, pons and cerebellum, overall mild-to-moderate in severity and slightly progressed from the prior brain MRI of 10/17/2004. Findings are nonspecific, but most often secondary to chronic small vessel ischemia.     Electronically Signed   By: Jackey Loge D.O.   On: 10/01/2023 08:10  Allergies  Allergen Reactions   Codeine Nausea Only   Doxycycline     Abdominal pain    Penicillins Hives   Sulfa Antibiotics Hives     DIAGNOSTIC DATA (LABS, IMAGING, TESTING) - I reviewed patient records, labs,  notes, testing and imaging myself where available.  Lab Results  Component Value Date   WBC 6.0 06/24/2022   HGB 14.0 06/24/2022   HCT 41.5 06/24/2022   MCV 91.0 06/24/2022   PLT 216 06/24/2022      Component Value Date/Time   NA 144 06/24/2022 2332   K 3.6 06/24/2022 2332   CL 108 06/24/2022 2332   CO2 27 06/24/2022 2332   GLUCOSE 109 (H) 06/24/2022 2332   BUN 9 06/24/2022 2332   CREATININE 0.58 06/24/2022 2332   CALCIUM 10.3 06/24/2022 2332   GFRNONAA >60 06/24/2022 2332   No results found for: "CHOL", "HDL", "LDLCALC", "LDLDIRECT", "TRIG", "CHOLHDL" No results found for: "HGBA1C" Lab Results  Component Value Date   VITAMINB12 413 08/05/2022   Lab Results  Component Value Date   TSH 1.640 08/05/2022    PHYSICAL EXAM:  Today's Vitals   12/31/23 0909  BP: 128/77  Pulse: 66  Weight: 161 lb 3.2 oz (73.1 kg)  Height: 5\' 9"  (1.753 m)   Body mass index is 23.81 kg/m.   Wt Readings from Last 3 Encounters:  12/31/23 161 lb 3.2 oz (73.1 kg)  12/28/23 146 lb (66.2 kg)  11/05/23 146 lb (66.2 kg)     Ht Readings from Last 3 Encounters:  12/31/23 5\' 9"  (1.753 m)  12/28/23 5\' 9"  (1.753 m)  11/05/23 5\' 9"  (1.753 m)      General: The patient is awake, alert and appears not in acute distress. The patient is well groomed. Head: Normocephalic, atraumatic. Neck is supple. Mallampati 3- neck circumference:15.25 " larger neck, slender shoulders, droopy shoulder. .  Nasal airflow patent , TMJ is not evident . Retrognathia is seen.  Cardiovascular:  Regular rate and rhythm,  Respiratory: Lungs are  clear to auscultation.     Neurologic exam :The patient is awake and alert, oriented to place and time.   Memory subjective described as intact.  Attention span & concentration ability appears normal.  Speech is fluent, with a nasal speech, but  talkative are appropriate.   Cranial nerves: taste and smell intact.  Pupils are equal and briskly reactive to light.  Visual  fields by finger perimetry are intact.  Hearing to soft voice is  mildly reduced. Tinnitus.  Facial sensation intact to fine touch. Facial motor strength is symmetric and tongue and uvula move midline. Shoulder shrug was symmetrical.    Motor exam: Normal tone, muscle bulk and symmetric strength in all extremities. Sensory:  Fine touch, pinprick and vibration was normal. Coordination: Finger-to-nose maneuver  normal without evidence of ataxia, dysmetria or tremor. Gait and station: Patient walks without assistive device. Deep tendon reflexes: in the  upper and lower extremities are symmetric at 2 plus and intact.    ASSESSMENT AND PLAN 48 - year - old female  here with:    1) chronic short sleeper, but functioning well, no daytime sleepiness.  Take melatonin and live happy ever after.   2) very good CPAP compliance - for a short sleeper this can be a challenge.   3) if she wants to try mirtazepin, I will offer.  She declined.    I plan to follow up either personally or through our NP within 12 months.   I would like to thank Patricia Greathouse, MD and [place] [address] [place],  [place] [address] for allowing me to meet with and to take care of this pleasant patient.     After spending a total time of  25  minutes face to face and additional time for physical and neurologic examination, review of laboratory studies,  personal review of imaging studies, reports and results of other testing and review of referral information / records as far as provided in visit,   Electronically signed by: Patricia Novas, MD 12/31/2023 9:49 AM  [facility] Board certified by The [employer] of Sleep Medicine and Diplomate of the [employer] of [employer]. Board certified In Neurology through the ABPN, Fellow of the [employer] of [employer].

## 2024-01-03 ENCOUNTER — Other Ambulatory Visit (HOSPITAL_BASED_OUTPATIENT_CLINIC_OR_DEPARTMENT_OTHER): Payer: Self-pay | Admitting: Cardiology

## 2024-01-03 DIAGNOSIS — I10 Essential (primary) hypertension: Secondary | ICD-10-CM

## 2024-01-04 ENCOUNTER — Encounter: Payer: Self-pay | Admitting: Gastroenterology

## 2024-01-05 ENCOUNTER — Ambulatory Visit: Payer: HMO | Admitting: Neurology

## 2024-01-05 DIAGNOSIS — G44009 Cluster headache syndrome, unspecified, not intractable: Secondary | ICD-10-CM

## 2024-01-05 DIAGNOSIS — R002 Palpitations: Secondary | ICD-10-CM

## 2024-01-05 DIAGNOSIS — F518 Other sleep disorders not due to a substance or known physiological condition: Secondary | ICD-10-CM

## 2024-01-05 DIAGNOSIS — G4733 Obstructive sleep apnea (adult) (pediatric): Secondary | ICD-10-CM | POA: Diagnosis not present

## 2024-01-06 NOTE — Progress Notes (Signed)
[facility] 68 year old female [birth year]   HOME SLEEP TEST REPORT ( by Watch PAT)   STUDY DATE:  01-05-2024-    ORDERING CLINICIAN:  Melvyn Novas, MD  REFERRING CLINICIAN:  Dr Felipa Eth   CLINICAL INFORMATION/HISTORY: Patricia Rangel is a 68 y.o. female patient who is here for revisit 10/01/2023 for  a face to face revisit to allow her to undergo a new sleep study. Her machine is issued after a titration study / 9-19-2017and cpap titration was on 10-14-2026 ,  nasal pillow air fit p 10 nasal medium size. Set at 7 cm water .  Chief concern according to patient :  I need a new machine  and my medicare status just allows me to get tested.    The patient continues compliantly to use her old CPAP machine, with a date compliance of 93% and an hour compliance of 75%.  So there are several days where she did not make the 4-hour mark.  On average she is using the machine 5 hours and 24 minutes.  CPAP is still set at 7 cm of water with 3 cm EPR and her residual AHI is 2.5/h is a higher component of central apnea than obstructive.  95th percentile air leak was 11.5 L a minute which is very typical for a nasal pillow mask.   Epworth sleepiness score today endorsed at 7 and the fatigue severity score endorsed at 22 points and her GDS at 1 out of 15 points.   She takes power naps most afternoons. These are less than 30 minutes long , refreshing for 2-3 hours.      Epworth sleepiness score: 7 /24. FSS at 22/ 63 points, GDS at 1/ 15 points.    BMI: 22.15 kg/m   Neck Circumference:  15.25''   FINDINGS:   Sleep Summary:   Total Recording Time (hours, min):     9 hours 18 minutes   Total Sleep Time (hours, min):        8 hours 30 minutes         Percent REM (%):      29%                                  Respiratory Indices:   Calculated pAHI (per hour):     19/h following AASM criteria and 9.4/h following CMS criteria.    No central events were identified.                     REM pAHI:          34/h                                       NREM pAHI:     13/h                         Positional AHI:    The patient slept the longest time in supine for a total of 184 minutes.  The AHI by AASM criteria was 26.3/h and by CMS criteria 14.3/h.  This was followed by right lateral sleep 430 minutes with an AHI of 22.9/h 110 minutes of prone sleep were associated with an AHI of 13.5/h and left lateral  sleep 17 minutes was associated with an AHI of 0/h.  Snoring.  Level reached a mean volume of 42 dB and was present for over half of the total recorded sleep time.  REM sleep latency was 108 minutes, sleep latency 23 minutes, and wakefulness after sleep onset time was 42 minutes.                                               Oxygen Saturation Statistics:   Oxygen Saturation (%) Mean:     92%          Minimum oxygen saturation (%):   78%         O2 Saturation Range (%):    Between 78 and 98%                                   O2 Saturation (minutes) <89%:     1.9 minutes      Pulse Rate Statistics:   Pulse Mean (bpm):      61 bpm           Pulse Range:     Between 51 and 96 bpm.  Please note that this home sleep test device cannot give cardiac rhythm information.            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  The patient does qualify for CPAP therapy continuum.   RECOMMENDATION: This patient is willing to continue with CPAP therapy.  Her current machine is set at 7 cm water pressure and she is using an AirFit P10 nasal pillow and medium size.  I will order a new CPAP auto titration device with a setting between 5 and 10 cm water, 3 cm EPR, heated humidification and she can be fitted for any interface she prefers.     INTERPRETING PHYSICIAN:   Melvyn Novas, MD

## 2024-01-11 DIAGNOSIS — D1801 Hemangioma of skin and subcutaneous tissue: Secondary | ICD-10-CM | POA: Diagnosis not present

## 2024-01-11 DIAGNOSIS — L821 Other seborrheic keratosis: Secondary | ICD-10-CM | POA: Diagnosis not present

## 2024-01-11 DIAGNOSIS — L578 Other skin changes due to chronic exposure to nonionizing radiation: Secondary | ICD-10-CM | POA: Diagnosis not present

## 2024-01-11 DIAGNOSIS — L608 Other nail disorders: Secondary | ICD-10-CM | POA: Diagnosis not present

## 2024-01-11 DIAGNOSIS — L570 Actinic keratosis: Secondary | ICD-10-CM | POA: Diagnosis not present

## 2024-01-11 DIAGNOSIS — Z859 Personal history of malignant neoplasm, unspecified: Secondary | ICD-10-CM | POA: Diagnosis not present

## 2024-01-12 ENCOUNTER — Encounter: Payer: Self-pay | Admitting: Neurology

## 2024-01-12 NOTE — Procedures (Signed)
[facility] 68 year old female [birth year]   HOME SLEEP TEST REPORT ( by Watch PAT)   STUDY DATE:  01-05-2024-    ORDERING CLINICIAN:  Melvyn Novas, MD  REFERRING CLINICIAN:  Dr Felipa Eth   CLINICAL INFORMATION/HISTORY: Patricia Rangel is a 68 y.o. female patient who is here for revisit 10/01/2023 for  a face to face revisit to allow her to undergo a new sleep study. Her machine is issued after a titration study / 9-19-2017and cpap titration was on 10-14-2026 ,  nasal pillow air fit p 10 nasal medium size. Set at 7 cm water .  Chief concern according to patient :  I need a new machine  and my medicare status just allows me to get tested.    The patient continues compliantly to use her old CPAP machine, with a date compliance of 93% and an hour compliance of 75%.  So there are several days where she did not make the 4-hour mark.  On average she is using the machine 5 hours and 24 minutes.  CPAP is still set at 7 cm of water with 3 cm EPR and her residual AHI is 2.5/h is a higher component of central apnea than obstructive.  95th percentile air leak was 11.5 L a minute which is very typical for a nasal pillow mask.   Epworth sleepiness score today endorsed at 7 and the fatigue severity score endorsed at 22 points and her GDS at 1 out of 15 points.   She takes power naps most afternoons. These are less than 30 minutes long , refreshing for 2-3 hours.      Epworth sleepiness score: 7 /24. FSS at 22/ 63 points, GDS at 1/ 15 points.    BMI: 22.15 kg/m   Neck Circumference:  15.25''   FINDINGS:   Sleep Summary:   Total Recording Time (hours, min):     9 hours 18 minutes   Total Sleep Time (hours, min):        8 hours 30 minutes         Percent REM (%):      29%                                  Respiratory Indices:   Calculated pAHI (per hour):     19/h following AASM criteria and 9.4/h following CMS criteria.    No central events were identified.                     REM pAHI:          34/h                                       NREM pAHI:     13/h                         Positional AHI:    The patient slept the longest time in supine for a total of 184 minutes.  The AHI by AASM criteria was 26.3/h and by CMS criteria 14.3/h.  This was followed by right lateral sleep 430 minutes with an AHI of 22.9/h 110 minutes of prone sleep were associated with an AHI of 13.5/h and left lateral  sleep 17 minutes was associated with an AHI of 0/h.  Snoring.  Level reached a mean volume of 42 dB and was present for over half of the total recorded sleep time.  REM sleep latency was 108 minutes, sleep latency 23 minutes, and wakefulness after sleep onset time was 42 minutes.                                               Oxygen Saturation Statistics:   Oxygen Saturation (%) Mean:     92%          Minimum oxygen saturation (%):   78%         O2 Saturation Range (%):    Between 78 and 98%                                   O2 Saturation (minutes) <89%:     1.9 minutes      Pulse Rate Statistics:   Pulse Mean (bpm):      61 bpm           Pulse Range:     Between 51 and 96 bpm.  Please note that this home sleep test device cannot give cardiac rhythm information.            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  The patient does qualify for CPAP therapy continuum.   RECOMMENDATION: This patient is willing to continue with CPAP therapy.  Her current machine is set at 7 cm water pressure and she is using an AirFit P10 nasal pillow and medium size.  I will order a new CPAP auto titration device with a setting between 5 and 10 cm water, 3 cm EPR, heated humidification and she can be fitted for any interface she prefers.     INTERPRETING PHYSICIAN:   Melvyn Novas, MD

## 2024-01-13 ENCOUNTER — Encounter: Payer: Self-pay | Admitting: Neurology

## 2024-01-26 DIAGNOSIS — G4733 Obstructive sleep apnea (adult) (pediatric): Secondary | ICD-10-CM | POA: Diagnosis not present

## 2024-01-27 DIAGNOSIS — J020 Streptococcal pharyngitis: Secondary | ICD-10-CM | POA: Diagnosis not present

## 2024-01-27 DIAGNOSIS — J029 Acute pharyngitis, unspecified: Secondary | ICD-10-CM | POA: Diagnosis not present

## 2024-01-27 DIAGNOSIS — R059 Cough, unspecified: Secondary | ICD-10-CM | POA: Diagnosis not present

## 2024-01-27 DIAGNOSIS — R5383 Other fatigue: Secondary | ICD-10-CM | POA: Diagnosis not present

## 2024-01-27 DIAGNOSIS — R0981 Nasal congestion: Secondary | ICD-10-CM | POA: Diagnosis not present

## 2024-01-27 DIAGNOSIS — G4733 Obstructive sleep apnea (adult) (pediatric): Secondary | ICD-10-CM | POA: Diagnosis not present

## 2024-01-27 DIAGNOSIS — Z1152 Encounter for screening for COVID-19: Secondary | ICD-10-CM | POA: Diagnosis not present

## 2024-02-04 ENCOUNTER — Other Ambulatory Visit (HOSPITAL_BASED_OUTPATIENT_CLINIC_OR_DEPARTMENT_OTHER): Payer: Self-pay | Admitting: Cardiology

## 2024-02-04 DIAGNOSIS — I10 Essential (primary) hypertension: Secondary | ICD-10-CM

## 2024-02-08 DIAGNOSIS — J020 Streptococcal pharyngitis: Secondary | ICD-10-CM | POA: Diagnosis not present

## 2024-02-08 DIAGNOSIS — J029 Acute pharyngitis, unspecified: Secondary | ICD-10-CM | POA: Diagnosis not present

## 2024-02-11 ENCOUNTER — Encounter (INDEPENDENT_AMBULATORY_CARE_PROVIDER_SITE_OTHER): Payer: Self-pay | Admitting: Otolaryngology

## 2024-02-11 ENCOUNTER — Ambulatory Visit (INDEPENDENT_AMBULATORY_CARE_PROVIDER_SITE_OTHER): Payer: HMO | Admitting: Otolaryngology

## 2024-02-11 VITALS — BP 133/86 | HR 78 | Ht 69.0 in | Wt 157.0 lb

## 2024-02-11 DIAGNOSIS — J312 Chronic pharyngitis: Secondary | ICD-10-CM | POA: Diagnosis not present

## 2024-02-11 DIAGNOSIS — R0981 Nasal congestion: Secondary | ICD-10-CM

## 2024-02-11 DIAGNOSIS — R0982 Postnasal drip: Secondary | ICD-10-CM | POA: Diagnosis not present

## 2024-02-11 DIAGNOSIS — K219 Gastro-esophageal reflux disease without esophagitis: Secondary | ICD-10-CM

## 2024-02-11 DIAGNOSIS — J3089 Other allergic rhinitis: Secondary | ICD-10-CM

## 2024-02-11 MED ORDER — FAMOTIDINE 20 MG PO TABS: 20.0000 mg | ORAL_TABLET | Freq: Two times a day (BID) | ORAL | 1 refills | Status: DC

## 2024-02-11 MED ORDER — CETIRIZINE HCL 10 MG PO TABS: 10.0000 mg | ORAL_TABLET | Freq: Every day | ORAL | 11 refills | Status: AC

## 2024-02-11 MED ORDER — FLUTICASONE PROPIONATE 50 MCG/ACT NA SUSP: 2.0000 | Freq: Every day | NASAL | 6 refills | Status: DC

## 2024-02-11 NOTE — Patient Instructions (Signed)

## 2024-02-11 NOTE — Progress Notes (Signed)
 ENT CONSULT:  Reason for Consult: chronic sore throat and recurrent strep infection  HPI: Discussed the use of AI scribe software for clinical note transcription with the patient, who gave verbal consent to proceed.  History of Present Illness   Patricia Rangel is a 68 year old female with hx of tonsillectomy ~ 30 yrs ago who presents with persistent sore throat and recurrent strep infections. She was referred by her primary care doctor for evaluation of her recurrent strep throat.  She has a long-standing history of recurrent strep throat infections, beginning in childhood. Despite undergoing a tonsillectomy over 30 years ago, she continues to experience frequent episodes of sore throat, confirmed as strep throat through positive throat cultures. We do not have access to the records, but she reports that her PCP swabs her throat for strep and it is usually positive.   She describes her sore throat as feeling like 'something citrusy' blocking her throat and experiences significant fatigue, stating that her body feels 'exhausted' from the recurrent infections. She also reports a sensation of 'tingly tight' feeling in her nose and ears.  Currently on her third round of antibiotics, she was initially prescribed a Z-Pak, followed by clindamycin when symptoms persisted, and then another Z-Pak after a subsequent strep positive test. Her symptoms, including sore throat and fatigue, return within two days of completing antibiotic courses.  She reports postnasal drainage and nasal congestion, with a sensation of 'tingly tight' feeling in her nose and ears. She has been using Flonase and Zyrtec for her nasal symptoms.  In the past, she was referred to an infectious disease specialist when her symptoms were particularly persistent, although at that time, tests returned negative results.     Records Reviewed:  Office visit with PCP 01/27/24 exposure to strep & COVID, finished abx, not feeling all the way  better, see phone notept states she finished zpack yesterday, still has dry cough, achy throat, no energy. denies fever and HA. has been taking robitussen and tylenol   - HPI:    Patient History:         68 yo F presents with concern over non resolved ST, occ cough, and no energy after calling last wk to report exposure to strep & COVID, finished Zpack yesterday and not feeling all the way better She has been taking robitussen and tylenol Eating and drinking well Denies fever, chills, difficulty swallowing  Has allergy to PCN.     Past Medical History:  Diagnosis Date   Basal cell carcinoma    Headache    Hypertension    Mitral valve prolapse    Sleep apnea    cpap   SVT (supraventricular tachycardia) (HCC)     Past Surgical History:  Procedure Laterality Date   BACK SURGERY  12/15/1996   COLONOSCOPY  2014   ROOT CANAL     TONSILLECTOMY  12/15/1992    Family History  Problem Relation Age of Onset   Hypertension Mother    Thyroid disease Mother    Hypercholesterolemia Mother    Diabetes Father    Hypertension Father    Colon cancer Neg Hx    Esophageal cancer Neg Hx    Rectal cancer Neg Hx    Stomach cancer Neg Hx    Colon polyps Neg Hx     Social History:  reports that she has never smoked. She has been exposed to tobacco smoke. She has never used smokeless tobacco. She reports that she does not drink alcohol  and does not use drugs.  Allergies:  Allergies  Allergen Reactions   Codeine Nausea Only   Doxycycline     Abdominal pain    Penicillins Hives   Sulfa Antibiotics Hives    Medications: I have reviewed the patient's current medications.  The PMH, PSH, Medications, Allergies, and SH were reviewed and updated.  ROS: Constitutional: Negative for fever, weight loss and weight gain. Cardiovascular: Negative for chest pain and dyspnea on exertion. Respiratory: Is not experiencing shortness of breath at rest. Gastrointestinal: Negative for nausea and  vomiting. Neurological: Negative for headaches. Psychiatric: The patient is not nervous/anxious  Blood pressure 133/86, pulse 78, height 5\' 9"  (1.753 m), weight 157 lb (71.2 kg), SpO2 99%. Body mass index is 23.18 kg/m.  PHYSICAL EXAM:  Exam: General: Well-developed, well-nourished Respiratory Respiratory effort: Equal inspiration and expiration without stridor Cardiovascular Peripheral Vascular: Warm extremities with equal color/perfusion Eyes: No nystagmus with equal extraocular motion bilaterally Neuro/Psych/Balance: Patient oriented to person, place, and time; Appropriate mood and affect; Gait is intact with no imbalance; Cranial nerves I-XII are intact Head and Face Inspection: Normocephalic and atraumatic without mass or lesion Palpation: Facial skeleton intact without bony stepoffs Salivary Glands: No mass or tenderness Facial Strength: Facial motility symmetric and full bilaterally ENT Pinna: External ear intact and fully developed External canal: Canal is patent with intact skin Tympanic Membrane: Clear and mobile External Nose: No scar or anatomic deformity Internal Nose: Septum is S-shaped. No polyp, or purulence. Mucosal edema and erythema present.  Bilateral inferior turbinate hypertrophy.  Lips, Teeth, and gums: Mucosa and teeth intact and viable TMJ: No pain to palpation with full mobility Oral cavity/oropharynx: No erythema or exudate, no lesions present Nasopharynx: No mass or lesion with intact mucosa Hypopharynx: Intact mucosa without pooling of secretions Larynx Glottic: Full true vocal cord mobility without lesion or mass Supraglottic: Normal appearing epiglottis and AE folds Interarytenoid Space: Moderate pachydermia&edema Subglottic Space: Patent without lesion or edema Neck Neck and Trachea: Midline trachea without mass or lesion Thyroid: No mass or nodularity Lymphatics: No lymphadenopathy  Procedure: Preoperative diagnosis: chronic sore  throat  Postoperative diagnosis:   Same + GERD LPR  Procedure: Flexible fiberoptic laryngoscopy  Surgeon: Ashok Croon, MD  Anesthesia: Topical lidocaine and Afrin Complications: None Condition is stable throughout exam  Indications and consent:  The patient presents to the clinic with above symptoms. Indirect laryngoscopy view was incomplete. Thus it was recommended that they undergo a flexible fiberoptic laryngoscopy. All of the risks, benefits, and potential complications were reviewed with the patient preoperatively and verbal informed consent was obtained.  Procedure: The patient was seated upright in the clinic. Topical lidocaine and Afrin were applied to the nasal cavity. After adequate anesthesia had occurred, I then proceeded to pass the flexible telescope into the nasal cavity. The nasal cavity was patent without rhinorrhea or polyp. The nasopharynx was also patent without mass or lesion. The base of tongue was visualized and was normal. There were no signs of pooling of secretions in the piriform sinuses. The true vocal folds were mobile bilaterally. There were no signs of glottic or supraglottic mucosal lesion or mass. There was moderate interarytenoid pachydermia and post cricoid edema. The telescope was then slowly withdrawn and the patient tolerated the procedure throughout.   Studies Reviewed:labs - normal TSH, Vit D and B12 08/05/22  Assessment/Plan: Encounter Diagnoses  Name Primary?   Chronic sore throat Yes   Chronic GERD    Environmental and seasonal allergies  Chronic nasal congestion    Post-nasal drip     Assessment and Plan    Recurrent Pharyngitis Recurrent pharyngitis with positive strep cultures based on patient report despite tonsillectomy 30 years ago. Multiple antibiotic courses (Z-Pak, clindamycin) provided temporary relief. Examination: no tonsillar tissue, no inflammation, no exudate. Differential includes silent reflux and non-infectious causes  or sore throat vs viral URA which subsided. Throat clearing discussed as a symptom of silent reflux. Management includes diet, supplementation with Reflux Gourmet after meals, and medications like Pepcid. Non-antibiotic treatments recommended for future flare-ups. - Start Pepcid for 8 week trial - Continue Flonase 2 puffs b/l nares and Zyrtec 10 mg daily - Gargle with salt water in the morning - Consider dietary and lifestyle modifications for reflux - Order supplement Reflux Gourmet and take after meals - see PCP if sx recur  Chronic Nasal Congestion post-nasal drainage Nasal congestion, postnasal drainage - Continue Flonase 2 puffs b/l nares and Zyrtec 10 mg daily  GERD LPR - Pepcid 20 mg BID  -  Reflux Gourmet after meals - diet and lifestyle changes to minimize GERD - Refer to [employer] for dietary and lifestyle modifications/reflux cook book  Follow-up - Follow up with primary care for further management of pharyngitis if symptoms persist.          Thank you for allowing me to participate in the care of this patient. Please do not hesitate to contact me with any questions or concerns.   Ashok Croon, MD Otolaryngology [facility] Phone: [phone] Fax: [phone]    02/11/2024, 2:41 PM

## 2024-02-18 ENCOUNTER — Other Ambulatory Visit (INDEPENDENT_AMBULATORY_CARE_PROVIDER_SITE_OTHER): Payer: Self-pay | Admitting: Otolaryngology

## 2024-02-24 DIAGNOSIS — G4733 Obstructive sleep apnea (adult) (pediatric): Secondary | ICD-10-CM | POA: Diagnosis not present

## 2024-03-14 DIAGNOSIS — Z6824 Body mass index (BMI) 24.0-24.9, adult: Secondary | ICD-10-CM | POA: Diagnosis not present

## 2024-03-14 DIAGNOSIS — Z01419 Encounter for gynecological examination (general) (routine) without abnormal findings: Secondary | ICD-10-CM | POA: Diagnosis not present

## 2024-03-14 DIAGNOSIS — Z1231 Encounter for screening mammogram for malignant neoplasm of breast: Secondary | ICD-10-CM | POA: Diagnosis not present

## 2024-03-26 DIAGNOSIS — G4733 Obstructive sleep apnea (adult) (pediatric): Secondary | ICD-10-CM | POA: Diagnosis not present

## 2024-03-28 DIAGNOSIS — R748 Abnormal levels of other serum enzymes: Secondary | ICD-10-CM | POA: Diagnosis not present

## 2024-03-28 DIAGNOSIS — D72819 Decreased white blood cell count, unspecified: Secondary | ICD-10-CM | POA: Diagnosis not present

## 2024-03-28 DIAGNOSIS — I10 Essential (primary) hypertension: Secondary | ICD-10-CM | POA: Diagnosis not present

## 2024-03-28 DIAGNOSIS — Z79899 Other long term (current) drug therapy: Secondary | ICD-10-CM | POA: Diagnosis not present

## 2024-04-06 DIAGNOSIS — M79605 Pain in left leg: Secondary | ICD-10-CM | POA: Diagnosis not present

## 2024-04-06 DIAGNOSIS — Z0000 Encounter for general adult medical examination without abnormal findings: Secondary | ICD-10-CM | POA: Diagnosis not present

## 2024-04-06 DIAGNOSIS — M419 Scoliosis, unspecified: Secondary | ICD-10-CM | POA: Diagnosis not present

## 2024-04-06 DIAGNOSIS — I10 Essential (primary) hypertension: Secondary | ICD-10-CM | POA: Diagnosis not present

## 2024-04-06 DIAGNOSIS — H6123 Impacted cerumen, bilateral: Secondary | ICD-10-CM | POA: Diagnosis not present

## 2024-04-06 DIAGNOSIS — J020 Streptococcal pharyngitis: Secondary | ICD-10-CM | POA: Diagnosis not present

## 2024-04-06 DIAGNOSIS — G4730 Sleep apnea, unspecified: Secondary | ICD-10-CM | POA: Diagnosis not present

## 2024-04-06 DIAGNOSIS — J309 Allergic rhinitis, unspecified: Secondary | ICD-10-CM | POA: Diagnosis not present

## 2024-04-06 DIAGNOSIS — M48061 Spinal stenosis, lumbar region without neurogenic claudication: Secondary | ICD-10-CM | POA: Diagnosis not present

## 2024-04-06 DIAGNOSIS — G4700 Insomnia, unspecified: Secondary | ICD-10-CM | POA: Diagnosis not present

## 2024-04-06 DIAGNOSIS — R196 Halitosis: Secondary | ICD-10-CM | POA: Diagnosis not present

## 2024-04-06 DIAGNOSIS — Z1211 Encounter for screening for malignant neoplasm of colon: Secondary | ICD-10-CM | POA: Diagnosis not present

## 2024-04-20 DIAGNOSIS — L218 Other seborrheic dermatitis: Secondary | ICD-10-CM | POA: Diagnosis not present

## 2024-04-25 DIAGNOSIS — M1712 Unilateral primary osteoarthritis, left knee: Secondary | ICD-10-CM | POA: Diagnosis not present

## 2024-04-25 DIAGNOSIS — M545 Low back pain, unspecified: Secondary | ICD-10-CM | POA: Diagnosis not present

## 2024-04-25 DIAGNOSIS — G4733 Obstructive sleep apnea (adult) (pediatric): Secondary | ICD-10-CM | POA: Diagnosis not present

## 2024-04-25 DIAGNOSIS — M25561 Pain in right knee: Secondary | ICD-10-CM | POA: Diagnosis not present

## 2024-05-02 DIAGNOSIS — G4730 Sleep apnea, unspecified: Secondary | ICD-10-CM | POA: Diagnosis not present

## 2024-05-02 DIAGNOSIS — R002 Palpitations: Secondary | ICD-10-CM | POA: Diagnosis not present

## 2024-05-02 DIAGNOSIS — G4700 Insomnia, unspecified: Secondary | ICD-10-CM | POA: Diagnosis not present

## 2024-05-02 DIAGNOSIS — M48061 Spinal stenosis, lumbar region without neurogenic claudication: Secondary | ICD-10-CM | POA: Diagnosis not present

## 2024-05-02 DIAGNOSIS — R0789 Other chest pain: Secondary | ICD-10-CM | POA: Diagnosis not present

## 2024-05-02 DIAGNOSIS — I10 Essential (primary) hypertension: Secondary | ICD-10-CM | POA: Diagnosis not present

## 2024-05-20 DIAGNOSIS — M7918 Myalgia, other site: Secondary | ICD-10-CM | POA: Diagnosis not present

## 2024-05-20 DIAGNOSIS — M47816 Spondylosis without myelopathy or radiculopathy, lumbar region: Secondary | ICD-10-CM | POA: Diagnosis not present

## 2024-05-20 DIAGNOSIS — M545 Low back pain, unspecified: Secondary | ICD-10-CM | POA: Diagnosis not present

## 2024-05-26 DIAGNOSIS — G4733 Obstructive sleep apnea (adult) (pediatric): Secondary | ICD-10-CM | POA: Diagnosis not present

## 2024-06-02 DIAGNOSIS — G4730 Sleep apnea, unspecified: Secondary | ICD-10-CM | POA: Diagnosis not present

## 2024-06-02 DIAGNOSIS — M48061 Spinal stenosis, lumbar region without neurogenic claudication: Secondary | ICD-10-CM | POA: Diagnosis not present

## 2024-06-02 DIAGNOSIS — R0789 Other chest pain: Secondary | ICD-10-CM | POA: Diagnosis not present

## 2024-06-02 DIAGNOSIS — G4700 Insomnia, unspecified: Secondary | ICD-10-CM | POA: Diagnosis not present

## 2024-06-02 DIAGNOSIS — I10 Essential (primary) hypertension: Secondary | ICD-10-CM | POA: Diagnosis not present

## 2024-06-02 DIAGNOSIS — R002 Palpitations: Secondary | ICD-10-CM | POA: Diagnosis not present

## 2024-06-25 DIAGNOSIS — G4733 Obstructive sleep apnea (adult) (pediatric): Secondary | ICD-10-CM | POA: Diagnosis not present

## 2024-06-29 DIAGNOSIS — I10 Essential (primary) hypertension: Secondary | ICD-10-CM | POA: Diagnosis not present

## 2024-06-29 DIAGNOSIS — G4730 Sleep apnea, unspecified: Secondary | ICD-10-CM | POA: Diagnosis not present

## 2024-06-29 DIAGNOSIS — R002 Palpitations: Secondary | ICD-10-CM | POA: Diagnosis not present

## 2024-06-29 DIAGNOSIS — M48061 Spinal stenosis, lumbar region without neurogenic claudication: Secondary | ICD-10-CM | POA: Diagnosis not present

## 2024-06-29 DIAGNOSIS — G4700 Insomnia, unspecified: Secondary | ICD-10-CM | POA: Diagnosis not present

## 2024-07-20 DIAGNOSIS — C44722 Squamous cell carcinoma of skin of right lower limb, including hip: Secondary | ICD-10-CM | POA: Diagnosis not present

## 2024-07-20 DIAGNOSIS — L820 Inflamed seborrheic keratosis: Secondary | ICD-10-CM | POA: Diagnosis not present

## 2024-07-26 DIAGNOSIS — G4733 Obstructive sleep apnea (adult) (pediatric): Secondary | ICD-10-CM | POA: Diagnosis not present

## 2024-08-06 ENCOUNTER — Other Ambulatory Visit (INDEPENDENT_AMBULATORY_CARE_PROVIDER_SITE_OTHER): Payer: Self-pay | Admitting: Otolaryngology

## 2024-08-08 DIAGNOSIS — M170 Bilateral primary osteoarthritis of knee: Secondary | ICD-10-CM | POA: Diagnosis not present

## 2024-08-26 DIAGNOSIS — G4733 Obstructive sleep apnea (adult) (pediatric): Secondary | ICD-10-CM | POA: Diagnosis not present

## 2024-09-25 DIAGNOSIS — G4733 Obstructive sleep apnea (adult) (pediatric): Secondary | ICD-10-CM | POA: Diagnosis not present

## 2024-10-03 DIAGNOSIS — R1032 Left lower quadrant pain: Secondary | ICD-10-CM | POA: Diagnosis not present

## 2024-10-03 DIAGNOSIS — R002 Palpitations: Secondary | ICD-10-CM | POA: Diagnosis not present

## 2024-10-03 DIAGNOSIS — M1710 Unilateral primary osteoarthritis, unspecified knee: Secondary | ICD-10-CM | POA: Diagnosis not present

## 2024-10-03 DIAGNOSIS — J309 Allergic rhinitis, unspecified: Secondary | ICD-10-CM | POA: Diagnosis not present

## 2024-10-03 DIAGNOSIS — G4700 Insomnia, unspecified: Secondary | ICD-10-CM | POA: Diagnosis not present

## 2024-10-03 DIAGNOSIS — I10 Essential (primary) hypertension: Secondary | ICD-10-CM | POA: Diagnosis not present

## 2024-10-03 DIAGNOSIS — N390 Urinary tract infection, site not specified: Secondary | ICD-10-CM | POA: Diagnosis not present

## 2024-10-03 DIAGNOSIS — M419 Scoliosis, unspecified: Secondary | ICD-10-CM | POA: Diagnosis not present

## 2024-10-03 DIAGNOSIS — Z1211 Encounter for screening for malignant neoplasm of colon: Secondary | ICD-10-CM | POA: Diagnosis not present

## 2024-10-03 DIAGNOSIS — G4730 Sleep apnea, unspecified: Secondary | ICD-10-CM | POA: Diagnosis not present

## 2024-10-19 DIAGNOSIS — L738 Other specified follicular disorders: Secondary | ICD-10-CM | POA: Diagnosis not present

## 2024-10-26 DIAGNOSIS — G4733 Obstructive sleep apnea (adult) (pediatric): Secondary | ICD-10-CM | POA: Diagnosis not present

## 2024-12-29 NOTE — Progress Notes (Signed)
 SABRA

## 2025-01-02 ENCOUNTER — Encounter: Payer: Self-pay | Admitting: Neurology

## 2025-01-02 ENCOUNTER — Ambulatory Visit: Payer: HMO | Admitting: Neurology

## 2025-01-02 VITALS — BP 120/76 | HR 59 | Ht 69.0 in | Wt 172.4 lb

## 2025-01-02 DIAGNOSIS — G4733 Obstructive sleep apnea (adult) (pediatric): Secondary | ICD-10-CM

## 2025-01-02 DIAGNOSIS — G44019 Episodic cluster headache, not intractable: Secondary | ICD-10-CM

## 2025-01-02 MED ORDER — SERTRALINE HCL 25 MG PO TABS: 25.0000 mg | ORAL_TABLET | Freq: Every day | ORAL | 2 refills | Status: AC

## 2025-01-02 NOTE — Progress Notes (Signed)
 "        Provider:  Dedra Gores, MD  Primary Care Physician:  Janey Santos, MD [address] [place] [address]     Referring Provider: Janey Santos, Md [address] [place],  [place] [address]          Chief Complaint according to patient   Patient presents with:          Patient is here for a HA/CPAP follow-up - no issues with mask or machine. Still not sleeping well with machine. Would like to discuss her sleep patterns  sweats a lot at night, vivid dreams , episodic headaches, in AM.        HISTORY OF PRESENT ILLNESS:  Patricia Rangel is a 69 y.o. female patient who is here for revisit 01/02/2025 for  PAP follow up- .has a residual AHI of 5.1/h and this consistent of more centrals than obstructive.  Chief concern according to patient :  I JUST DON'T SLEEP WELL.  Poorly restorative sleep, night sweats and question why. No history of cardiac arrhythmia.  Well post menopausal changes.  Mild OSA by CMS criteria. BMI< 25 . Slight retrognathia.  Cluster headaches, episodic , still averaging 6 days a month on CPAP auto set 5-12 , 3 cm water EPR.  Her mask is  A NASAL CRADLE. SABRA Loan Hx : see previous note, her daughter has been dx with a sleep apnea.    Patricia Rangel is a 69 y.o. female patient who is here for revisit 10/01/2023 for  a face to face revisit to allow her to undergo a new sleep study. Her machine is issued after a titration study / 9-19-2017and cpap titration was on 10-14-2026 ,  nasal pillow air fit p 10 nasal medium size. Set at 7 cm water .  Chief concern according to patient :  I need a new machine  and my medicare status just allows me to get tested.    The patient continues compliantly to use her old CPAP machine, with a date compliance of 93% and an hour compliance of 75%.  So there are several days where she did not make the 4-hour mark.  On average she is using the machine 5 hours and 24 minutes.  CPAP is still set at 7 cm of  water with 3 cm EPR and her residual AHI is 2.5/h is a higher component of central apnea than obstructive.  95th percentile air leak was 11.5 L a minute which is very typical for a nasal pillow mask.   Epworth sleepiness score today endorsed at 7 and the fatigue severity score endorsed at 22 points and her GDS at 1 out of 15 points.   She takes power naps most afternoons. These are less than 30 minutes long , refreshing for 2-3 hours.    Patricia Rangel is a 69 y.o. female patient who is here for revisit 10/01/2023 for  a face to face revisit to allow her to undergo a new sleep study. Her machine is issued after a titration study / 9-19-2017and cpap titration was on 10-14-2026 ,  nasal pillow air fit p 10 nasal medium size. Set at 7 cm water .  Chief concern according to patient :  I need a new machine  and my medicare status just allows me to get tested.    The patient continues compliantly to use her old CPAP machine, with a date compliance  of 93% and an hour compliance of 75%.  So there are several days where she did not make the 4-hour mark.  On average she is using the machine 5 hours and 24 minutes.  CPAP is still set at 7 cm of water with 3 cm EPR and her residual AHI is 2.5/h is a higher component of central apnea than obstructive.  95th percentile air leak was 11.5 L a minute which is very typical for a nasal pillow mask.   Epworth sleepiness score today endorsed at 7 and the fatigue severity score endorsed at 22 points and her GDS at 1 out of 15 points.   She takes power naps most afternoons. These are less than 30 minutes long , refreshing for 2-3 hours.    RV for [facility] [place]: 03-27-2022; She reports being no longer happy with CPAP. 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 Her husband has had the same infection.  She reports not being able to get supplies now that the DME had changed to [employer].  She is due for anew machine,  and yet unsure if she wants to continue treatment.  We discussed  her insomnia too. She is willing to undergo meditation treatment.  She reports vivid dreams, crazy real , and she has no sleep attacks. 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     HST : Epworth sleepiness score: 7 /24. FSS at 22/ 63 points, GDS at 1/ 15 points.    BMI: 22.15 kg/m   Neck Circumference:  15.25''   FINDINGS:   Sleep Summary:   Total Recording Time (hours, min):     9 hours 18 minutes   Total Sleep Time (hours, min):        8 hours 30 minutes         Percent REM (%):      29%                                  Respiratory Indices:   Calculated pAHI (per hour):     19/h following AASM criteria and 9.4/h following CMS criteria.    No central events were identified.                    REM pAHI:          34/h                                       NREM pAHI:     13/h                         Positional AHI:    The patient slept the longest time in supine for a total of 184 minutes.  The AHI by AASM criteria was 26.3/h and by CMS criteria 14.3/h.  This was followed by right lateral sleep 430 minutes with an AHI of 22.9/h 110 minutes of prone sleep were associated with an AHI of 13.5/h and left lateral sleep 17 minutes was associated with an AHI of 0/h.   Snoring.  Level reached a mean volume of 42 dB and was present for over half of the total recorded sleep time.   REM sleep latency was  108 minutes, sleep latency 23 minutes, and wakefulness after sleep onset time was 42 minutes.                                               Oxygen Saturation Statistics:   Oxygen Saturation (%) Mean:     92%          Minimum oxygen saturation (%):   78%         O2 Saturation Range (%):    Between 78 and 98%                                   O2 Saturation (minutes) <89%:     1.9 minutes      Pulse Rate Statistics:   Pulse Mean (bpm):      61 bpm           Pulse Range:     Between 51 and 96 bpm.  Please  note that this home sleep test device cannot give cardiac rhythm information.            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  The patient does qualify for CPAP therapy continuum.   RECOMMENDATION: This patient is willing to continue with CPAP therapy.  Her current machine is set at 7 cm water pressure and she is using an AirFit P10 nasal pillow and medium size.  I will order a new CPAP auto titration device with a setting between 5 and 10 cm water, 3 cm EPR, heated humidification and she can be fitted for any interface she prefers.     Review of Systems: Out of a complete 14 system review, the patient complains of only the following symptoms, and all other reviewed systems are negative.:   SLEEPINESS ?  How likely are you to doze in the following situations: 0 = not likely, 1 = slight chance, 2 = moderate chance, 3 = high chance  Sitting and Reading? Watching Television? Sitting inactive in a public place (theater or meeting)? Lying down in the afternoon when circumstances permit? Sitting and talking to someone? Sitting quietly after lunch without alcohol ? In a car, while stopped for a few minutes in traffic? As a passenger in a car for an hour without a break?  Total = 4/ 24  Fss =24/       Social History   Socioeconomic History   Marital status: Married    Spouse name: [name]   Number of children: 4   Years of education: Not on file   Highest education level: Associate degree: academic program  Occupational History   Not on file  Tobacco Use   Smoking status: Never    Passive exposure: Past   Smokeless tobacco: Never  Vaping Use   Vaping status: Never Used  Substance and Sexual Activity   Alcohol  use: No   Drug use: No   Sexual activity: Yes  Other  Topics Concern   Not on file  Social History Narrative   Lives with husband and daughter   R handed   Caffeine: 12oz of coffee AM   Social  Drivers of Health   Tobacco Use: Low Risk (02/11/2024)   Patient History    Smoking Tobacco Use: Never    Smokeless Tobacco Use: Never    Passive Exposure: Past  Financial Resource Strain: Not on file  Food Insecurity: Not on file  Transportation Needs: Not on file  Physical Activity: Not on file  Stress: Not on file  Social Connections: Not on file  Depression (PHQ2-9): Not on file  Alcohol  Screen: Not on file  Housing: Not on file  Utilities: Not on file  Health Literacy: Not on file    Family History  Problem Relation Age of Onset   Hypertension Mother    Thyroid  disease Mother    Hypercholesterolemia Mother    Sleep apnea Mother    Sleep apnea Father    Diabetes Father    Hypertension Father    Colon cancer Neg Hx    Esophageal cancer Neg Hx    Rectal cancer Neg Hx    Stomach cancer Neg Hx    Colon polyps Neg Hx    Migraines Neg Hx    Seizures Neg Hx    Stroke Neg Hx     Past Medical History:  Diagnosis Date   Basal cell carcinoma    Headache    Hypertension    Mitral valve prolapse    Sleep apnea    cpap   SVT (supraventricular tachycardia)     Past Surgical History:  Procedure Laterality Date   BACK SURGERY  12/15/1996   COLONOSCOPY  2014   ROOT CANAL     TONSILLECTOMY  12/15/1992     Medications Ordered Prior to Encounter[1]  Allergies[2]   DIAGNOSTIC DATA (LABS, IMAGING, TESTING) - I reviewed patient records, labs, notes, testing and imaging myself where available.  Lab Results  Component Value Date   WBC 6.0 06/24/2022   HGB 14.0 06/24/2022   HCT 41.5 06/24/2022   MCV 91.0 06/24/2022   PLT 216 06/24/2022      Component Value Date/Time   NA 144 06/24/2022 2332   K 3.6 06/24/2022 2332   CL 108 06/24/2022 2332   CO2 27 06/24/2022 2332   GLUCOSE 109 (H) 06/24/2022 2332   BUN 9 06/24/2022  2332   CREATININE 0.58 06/24/2022 2332   CALCIUM 10.3 06/24/2022 2332   GFRNONAA >60 06/24/2022 2332   No results found for: CHOL, HDL, LDLCALC, LDLDIRECT, TRIG, CHOLHDL No results found for: YHAJ8R Lab Results  Component Value Date   VITAMINB12 413 08/05/2022   Lab Results  Component Value Date   TSH 1.640 08/05/2022    PHYSICAL EXAM:  Vitals:   01/02/25 0935  BP: 120/76  Pulse: (!) 59  SpO2: 97%   No data found. Body mass index is 25.46 kg/m.   Wt Readings from Last 3 Encounters:  01/02/25 172 lb 6.4 oz (78.2 kg)  02/11/24 157 lb (71.2 kg)  12/31/23 161 lb 3.2 oz (73.1 kg)     Ht Readings from Last 3 Encounters:  01/02/25 5' 9 (1.753 m)  02/11/24 5' 9 (1.753 m)  12/31/23 5' 9 (1.753 m)      General: The patient is awake, alert and appears not in acute distress and groomed. Head: Normocephalic, atraumatic.  Neck is supple.  Mallampati 2,  neck circumference:14.25 .  Nasal airflow patent , TMJ is not evident . Retrognathia is seen.  Cardiovascular:  Regular rate and rhythm, without  murmurs or carotid bruit, and without distended neck veins. Respiratory: Lungs are  clear to auscultation.     Neurologic exam :The patient is awake and alert, oriented to place and time.   Memory subjective described as intact.  Attention span & concentration ability appears normal.  Speech is fluent, without dysarthria, talkative are appropriate.   Cranial nerves: Pupils are equal and briskly reactive to light. Visual fields by finger perimetry are intact. Hearing to finger rub intact.   Facial sensation intact to fine touch. Facial motor strength is symmetric and tongue and uvula move midline. Shoulder shrug was symmetrical.    Motor exam: Normal tone, muscle bulk and symmetric strength in all extremities. Sensory:  Fine touch, pinprick and vibration was normal. Coordination: Finger-to-nose maneuver  normal without evidence of ataxia, dysmetria or  tremor. Gait and station: Patient walks without assistive device. Deep tendon reflexes: in th  upper and lower extremities are symmetric at 2 plus and intact.   ASSESSMENT AND PLAN :   69 y.o. year old female  here with:    1) non restorative sleep on CPAP , residual AHI is 5.1/h and CMS based HST AHI was 9.6/h. REM dependent apnea.    2)  continues on CPAP to have episodic cluster headaches.  These should be prevented in the sane way migraines are prevented. Migraines can cause nausea and tinnitus.   3)  continues to have diaphoresis and vivid dreams.    Plan : Trial of Zoloft  25 mg po at bedtime. Get AM blood pressure. ( If high, may need beta blocker or verapamil)  If that doesn't improve sleep quality after a trial of 30-60 days, I will order an in lab study PSG with expanded EEG montage .   I would like to thank  Janey Santos, Md [address] [place],  [place] [address] for allowing me to meet with this pleasant patient.   Sleep Clinic Patients are generally offered input on sleep hygiene, life style changes and how to improve compliance with medical treatment where applicable. Review and reiteration of good sleep hygiene measures is offered to any sleep clinic patient, be it in the first consultation or with any follow up visits.    Any patient with sleepiness should be cautioned not to drive, work at heights, or operate dangerous or heavy equipment when feeling tired or sleepy.      The patient will be seen in follow-up in the sleep clinic at [facility] for discussion of test results, sleep related symptoms and treatment compliance review, further management strategies, etc.   The referring provider will be notified of the test results.   The patient's condition requires frequent monitoring and adjustments in the treatment plan, reflecting the ongoing complexity of care.  This provider is the continuing focal point for all needed services for this condition.  After spending  a total time of  35  minutes face to face and time for  history taking, physical and neurologic examination, review of laboratory studies,  personal review of imaging studies, reports and results of other testing and review of referral information / records as far as provided in visit,   Electronically signed by: Dedra Gores, MD 01/02/2025 9:47 AM  [facility] Board certified by The [employer] of Sleep Medicine and Diplomate of the [employer] of [employer] Medicine. Board certified In Neurology through the ABPN, Fellow of the [employer] of Neu[employer].      [1]  Current Outpatient Medications on File Prior to Visit  Medication Sig Dispense Refill  amLODipine  (NORVASC ) 5 MG tablet TAKE 1 TABLET (5 MG TOTAL) BY MOUTH DAILY. 90 tablet 1   cetirizine  (ZYRTEC ) 10 MG tablet Take 1 tablet (10 mg total) by mouth daily. 30 tablet 11   famotidine  (PEPCID ) 20 MG tablet TAKE 1 TABLET BY MOUTH TWICE A DAY 180 tablet 1   fluticasone  (FLONASE ) 50 MCG/ACT nasal spray SPRAY 2 SPRAYS INTO EACH NOSTRIL EVERY DAY 48 mL 2   azithromycin (ZITHROMAX) 250 MG tablet 2 tablet on the first day, then 1 tablet daily for 4 days Oral daily for 5 days (Patient not taking: Reported on 01/02/2025)     MELATONIN PO Take 1 each by mouth at bedtime. (Patient not taking: Reported on 01/02/2025)     metoprolol  tartrate (LOPRESSOR ) 25 MG tablet TAKE 1 TABLET (25 MG TOTAL) BY MOUTH AS NEEDED (PALPITATIONS). (Patient not taking: Reported on 01/02/2025) 90 tablet 1   No current facility-administered medications on file prior to visit.  [2]  Allergies Allergen Reactions   Codeine Nausea Only   Doxycycline      Abdominal pain    Penicillins Hives   Sulfa Antibiotics Hives   "

## 2025-01-02 NOTE — Patient Instructions (Signed)
 69 y.o. year old female  here with:    1) non restorative sleep on CPAP , residual AHI is 5.1/h and CMS based HST AHI was 9.6/h. REM dependent apnea.    2)  continues on CPAP to have episodic cluster headaches.  These should be prevented in the sane way migraines are prevented. Migraines can cause nausea and tinnitus.   3)  continues to have diaphoresis and vivid dreams.    Plan : Trial of Zoloft  25 mg po at bedtime. Get AM blood pressure. ( If high, may need beta blocker or verapamil)  If that doesn't improve sleep quality after a trial of 30-60 days, I will order an in lab study PSG with expanded EEG montage .   I would like to thank  Janey Santos, Md [address] [place],  [place] [address] for allowing me to meet with this pleasant patient.

## 2025-07-03 ENCOUNTER — Ambulatory Visit: Admitting: Neurology
